# Patient Record
Sex: Male | Born: 1985 | Race: Black or African American | Hispanic: No | Smoking: Never smoker
Health system: Southern US, Community
[De-identification: ages and names within clinical notes are randomized; demographics above are authoritative.]

## PROBLEM LIST (undated history)

## (undated) DIAGNOSIS — I10 Essential (primary) hypertension: Secondary | ICD-10-CM

## (undated) DIAGNOSIS — E669 Obesity, unspecified: Secondary | ICD-10-CM

## (undated) HISTORY — DX: Essential (primary) hypertension: I10

## (undated) HISTORY — DX: Obesity, unspecified: E66.9

---

## 2019-03-18 ENCOUNTER — Encounter: Payer: Self-pay | Admitting: Family Medicine

## 2019-03-18 ENCOUNTER — Ambulatory Visit (INDEPENDENT_AMBULATORY_CARE_PROVIDER_SITE_OTHER): Payer: BC Managed Care – PPO | Admitting: Family Medicine

## 2019-03-18 ENCOUNTER — Other Ambulatory Visit: Payer: Self-pay

## 2019-03-18 VITALS — BP 150/100 | HR 98 | Temp 99.3°F | Ht 73.5 in | Wt 301.6 lb

## 2019-03-18 DIAGNOSIS — E669 Obesity, unspecified: Secondary | ICD-10-CM

## 2019-03-18 DIAGNOSIS — I10 Essential (primary) hypertension: Secondary | ICD-10-CM | POA: Diagnosis not present

## 2019-03-18 MED ORDER — AMLODIPINE BESYLATE 5 MG PO TABS: 5.0000 mg | ORAL_TABLET | Freq: Every day | ORAL | 2 refills | Status: DC

## 2019-03-18 MED ORDER — HYDROCHLOROTHIAZIDE 12.5 MG PO CAPS: 12.5000 mg | ORAL_CAPSULE | Freq: Every day | ORAL | 2 refills | Status: DC

## 2019-03-18 NOTE — Patient Instructions (Addendum)
Start back on the medications.  Keep an eye on your BP at home.  Goal BP is less than 130/80. It will take time to get there.   Cut back on foods high in sodium.  Start walking for exercise.   Follow up with me in 4 weeks. Bring in your BP cuff and your readings.     DASH Eating Plan DASH stands for "Dietary Approaches to Stop Hypertension." The DASH eating plan is a healthy eating plan that has been shown to reduce high blood pressure (hypertension). It may also reduce your risk for type 2 diabetes, heart disease, and stroke. The DASH eating plan may also help with weight loss. What are tips for following this plan?  General guidelines  Avoid eating more than 2,300 mg (milligrams) of salt (sodium) a day. If you have hypertension, you may need to reduce your sodium intake to 1,500 mg a day.  Limit alcohol intake to no more than 1 drink a day for nonpregnant women and 2 drinks a day for men. One drink equals 12 oz of beer, 5 oz of wine, or 1 oz of hard liquor.  Work with your health care provider to maintain a healthy body weight or to lose weight. Ask what an ideal weight is for you.  Get at least 30 minutes of exercise that causes your heart to beat faster (aerobic exercise) most days of the week. Activities may include walking, swimming, or biking.  Work with your health care provider or diet and nutrition specialist (dietitian) to adjust your eating plan to your individual calorie needs. Reading food labels   Check food labels for the amount of sodium per serving. Choose foods with less than 5 percent of the Daily Value of sodium. Generally, foods with less than 300 mg of sodium per serving fit into this eating plan.  To find whole grains, look for the word "whole" as the first word in the ingredient list. Shopping  Buy products labeled as "low-sodium" or "no salt added."  Buy fresh foods. Avoid canned foods and premade or frozen meals. Cooking  Avoid adding salt when  cooking. Use salt-free seasonings or herbs instead of table salt or sea salt. Check with your health care provider or pharmacist before using salt substitutes.  Do not fry foods. Cook foods using healthy methods such as baking, boiling, grilling, and broiling instead.  Cook with heart-healthy oils, such as olive, canola, soybean, or sunflower oil. Meal planning  Eat a balanced diet that includes: ? 5 or more servings of fruits and vegetables each day. At each meal, try to fill half of your plate with fruits and vegetables. ? Up to 6-8 servings of whole grains each day. ? Less than 6 oz of lean meat, poultry, or fish each day. A 3-oz serving of meat is about the same size as a deck of cards. One egg equals 1 oz. ? 2 servings of low-fat dairy each day. ? A serving of nuts, seeds, or beans 5 times each week. ? Heart-healthy fats. Healthy fats called Omega-3 fatty acids are found in foods such as flaxseeds and coldwater fish, like sardines, salmon, and mackerel.  Limit how much you eat of the following: ? Canned or prepackaged foods. ? Food that is high in trans fat, such as fried foods. ? Food that is high in saturated fat, such as fatty meat. ? Sweets, desserts, sugary drinks, and other foods with added sugar. ? Full-fat dairy products.  Do not salt foods  before eating.  Try to eat at least 2 vegetarian meals each week.  Eat more home-cooked food and less restaurant, buffet, and fast food.  When eating at a restaurant, ask that your food be prepared with less salt or no salt, if possible. What foods are recommended? The items listed may not be a complete list. Talk with your dietitian about what dietary choices are best for you. Grains Whole-grain or whole-wheat bread. Whole-grain or whole-wheat pasta. Brown rice. [place]. Bulgur. Whole-grain and low-sodium cereals. Pita bread. Low-fat, low-sodium crackers. Whole-wheat flour tortillas. Vegetables Fresh or frozen vegetables  (raw, steamed, roasted, or grilled). Low-sodium or reduced-sodium tomato and vegetable juice. Low-sodium or reduced-sodium tomato sauce and tomato paste. Low-sodium or reduced-sodium canned vegetables. Fruits All fresh, dried, or frozen fruit. Canned fruit in natural juice (without added sugar). Meat and other protein foods Skinless chicken or [place]. Ground chicken or [place]. Pork with fat trimmed off. Fish and seafood. Egg whites. Dried beans, peas, or lentils. Unsalted nuts, nut butters, and seeds. Unsalted canned beans. Lean cuts of beef with fat trimmed off. Low-sodium, lean deli meat. Dairy Low-fat (1%) or fat-free (skim) milk. Fat-free, low-fat, or reduced-fat cheeses. Nonfat, low-sodium ricotta or cottage cheese. Low-fat or nonfat yogurt. Low-fat, low-sodium cheese. Fats and oils Soft margarine without trans fats. Vegetable oil. Low-fat, reduced-fat, or light mayonnaise and salad dressings (reduced-sodium). Canola, safflower, olive, soybean, and sunflower oils. Avocado. Seasoning and other foods Herbs. Spices. Seasoning mixes without salt. Unsalted popcorn and pretzels. Fat-free sweets. What foods are not recommended? The items listed may not be a complete list. Talk with your dietitian about what dietary choices are best for you. Grains Baked goods made with fat, such as croissants, muffins, or some breads. Dry pasta or rice meal packs. Vegetables Creamed or fried vegetables. Vegetables in a cheese sauce. Regular canned vegetables (not low-sodium or reduced-sodium). Regular canned tomato sauce and paste (not low-sodium or reduced-sodium). Regular tomato and vegetable juice (not low-sodium or reduced-sodium). Angie Fava. Olives. Fruits Canned fruit in a light or heavy syrup. Fried fruit. Fruit in cream or butter sauce. Meat and other protein foods Fatty cuts of meat. Ribs. Fried meat. Berniece Salines. Sausage. Bologna and other processed lunch meats. Salami. Fatback. Hotdogs. Bratwurst. Salted nuts  and seeds. Canned beans with added salt. Canned or smoked fish. Whole eggs or egg yolks. Chicken or [place] with skin. Dairy Whole or 2% milk, cream, and half-and-half. Whole or full-fat cream cheese. Whole-fat or sweetened yogurt. Full-fat cheese. Nondairy creamers. Whipped toppings. Processed cheese and cheese spreads. Fats and oils Butter. Stick margarine. Lard. Shortening. Ghee. Bacon fat. Tropical oils, such as coconut, palm kernel, or palm oil. Seasoning and other foods Salted popcorn and pretzels. Onion salt, garlic salt, seasoned salt, table salt, and sea salt. Worcestershire sauce. Tartar sauce. Barbecue sauce. Teriyaki sauce. Soy sauce, including reduced-sodium. Steak sauce. Canned and packaged gravies. Fish sauce. Oyster sauce. Cocktail sauce. Horseradish that you find on the shelf. Ketchup. Mustard. Meat flavorings and tenderizers. Bouillon cubes. Hot sauce and Tabasco sauce. Premade or packaged marinades. Premade or packaged taco seasonings. Relishes. Regular salad dressings. Where to find more information:  National Heart, Lung, and [place]: https://wilson-eaton.com/  American Heart Association: www.heart.org Summary  The DASH eating plan is a healthy eating plan that has been shown to reduce high blood pressure (hypertension). It may also reduce your risk for type 2 diabetes, heart disease, and stroke.  With the DASH eating plan, you should limit salt (sodium) intake to 2,300 mg  a day. If you have hypertension, you may need to reduce your sodium intake to 1,500 mg a day.  When on the DASH eating plan, aim to eat more fresh fruits and vegetables, whole grains, lean proteins, low-fat dairy, and heart-healthy fats.  Work with your health care provider or diet and nutrition specialist (dietitian) to adjust your eating plan to your individual calorie needs. This information is not intended to replace advice given to you by your health care provider. Make sure you discuss any questions  you have with your health care provider. Document Revised: 12/09/2016 Document Reviewed: 12/21/2015 Elsevier Patient Education  2020 [employer].

## 2019-03-18 NOTE — Progress Notes (Signed)
   Subjective:    Patient ID: Don Ortega, male    DOB: 1985/12/13, 34 y.o.   MRN: [id number]  HPI Chief Complaint  Patient presents with  . new pt    new pt, get established, been on bp med for 2 years   He is new to the practice and here to establish care. 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  HTN- diagnosed 2018. Started on medication, he recalls taking losartan and his BP was not controlled. States his provider switched him to HCTZ and amlodipine. He took these for approx 4 months and then lost health insurance so he stopped the medication.  He denies fever, chills, headache, dizziness, chest pain, palpitations, shortness of breath, abdominal pain, nausea, vomiting, diarrhea, lower extremity edema.  No snoring or difficulty sleeping. Denies history of OSA.  States his diet is fairly high in sodium.  He eats fast food often.  Does not exercise.  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  Reviewed allergies, medications, past medical, surgical, family, and social history.    Review of Systems Pertinent positives and negatives in the history of present illness.     Objective:   Physical Exam BP (!) 150/100   Pulse 98   Temp 99.3 F (37.4 C)   Ht 6' 1.5" (1.867 m)   Wt (!) 301 lb 9.6 oz (136.8 kg)   SpO2 99%   BMI 39.25 kg/m   Alert and in no distress.  Cardiac exam shows a regular rhythm without murmurs or gallops. Lungs are clear to auscultation.  Extremities without edema.  DTRs are symmetric and normal.  Skin is warm and dry.       Assessment & Plan:  Uncontrolled hypertension - Plan: CBC with Differential/Platelet, Comprehensive metabolic panel, EKG 61-WERX, amLODipine (NORVASC) 5 MG tablet, hydrochlorothiazide (MICROZIDE) 12.5  MG capsule  Obesity (BMI 30-39.9) - Plan: CBC with Differential/Platelet, Comprehensive metabolic panel  Is a pleasant 34 year old male who is new to the practice and here to establish care. Uncontrolled hypertension and was recently given a 34-month extension by DOT provider to get his blood pressure under control.  He is motivated to do this. 2 years ago he was taking amlodipine and HCTZ and states his blood pressure was under good control then.  I will start him back on these medications.  Advised him to cut back on sodium in his diet and start walking.  Recommend gradual increase in exercise. EKG unremarkable today.  Read by myself and Dr. Redmond School. DASH diet handout given. Will check CBC, CMP. He will keep an eye on his blood pressure over the next 4 weeks and follow-up with me.

## 2019-03-19 LAB — COMPREHENSIVE METABOLIC PANEL
ALT: 89 IU/L — ABNORMAL HIGH (ref 0–44)
AST: 43 IU/L — ABNORMAL HIGH (ref 0–40)
Albumin/Globulin Ratio: 1.7 (ref 1.2–2.2)
Albumin: 4.8 g/dL (ref 4.0–5.0)
Alkaline Phosphatase: 82 IU/L (ref 39–117)
BUN/Creatinine Ratio: 5 — ABNORMAL LOW (ref 9–20)
BUN: 7 mg/dL (ref 6–20)
Bilirubin Total: 0.6 mg/dL (ref 0.0–1.2)
CO2: 23 mmol/L (ref 20–29)
Calcium: 10.6 mg/dL — ABNORMAL HIGH (ref 8.7–10.2)
Chloride: 103 mmol/L (ref 96–106)
Creatinine, Ser: 1.33 mg/dL — ABNORMAL HIGH (ref 0.76–1.27)
GFR calc Af Amer: 81 mL/min/{1.73_m2} (ref >59)
GFR calc non Af Amer: 70 mL/min/{1.73_m2} (ref >59)
Globulin, Total: 2.8 g/dL (ref 1.5–4.5)
Glucose: 87 mg/dL (ref 65–99)
Potassium: 3.9 mmol/L (ref 3.5–5.2)
Sodium: 143 mmol/L (ref 134–144)
Total Protein: 7.6 g/dL (ref 6.0–8.5)

## 2019-03-19 LAB — CBC WITH DIFFERENTIAL/PLATELET
Basophils Absolute: 0.1 10*3/uL (ref 0.0–0.2)
Basos: 1 %
EOS (ABSOLUTE): 0.1 10*3/uL (ref 0.0–0.4)
Eos: 1 %
Hematocrit: 50.3 % (ref 37.5–51.0)
Hemoglobin: 17.5 g/dL (ref 13.0–17.7)
Immature Grans (Abs): 0 10*3/uL (ref 0.0–0.1)
Immature Granulocytes: 0 %
Lymphocytes Absolute: 3.2 10*3/uL — ABNORMAL HIGH (ref 0.7–3.1)
Lymphs: 32 %
MCH: 30.2 pg (ref 26.6–33.0)
MCHC: 34.8 g/dL (ref 31.5–35.7)
MCV: 87 fL (ref 79–97)
Monocytes Absolute: 0.6 10*3/uL (ref 0.1–0.9)
Monocytes: 6 %
Neutrophils Absolute: 6 10*3/uL (ref 1.4–7.0)
Neutrophils: 60 %
Platelets: 338 10*3/uL (ref 150–450)
RBC: 5.8 x10E6/uL (ref 4.14–5.80)
RDW: 13.8 % (ref 11.6–15.4)
WBC: 10 10*3/uL (ref 3.4–10.8)

## 2019-03-27 LAB — SPECIMEN STATUS REPORT

## 2019-03-27 LAB — HEPATITIS PANEL, ACUTE
Hep A IgM: NEGATIVE
Hep B C IgM: NEGATIVE
Hep C Virus Ab: <0.1 s/co ratio (ref 0.0–0.9)
Hepatitis B Surface Ag: NEGATIVE

## 2019-04-07 ENCOUNTER — Encounter: Payer: Self-pay | Admitting: Family Medicine

## 2019-04-08 MED ORDER — AMLODIPINE BESYLATE 10 MG PO TABS: 10.0000 mg | ORAL_TABLET | Freq: Every day | ORAL | 0 refills | Status: DC

## 2019-04-15 ENCOUNTER — Telehealth: Payer: Self-pay | Admitting: Family Medicine

## 2019-04-15 NOTE — Telephone Encounter (Signed)
Pt coming in Friday for visit and wanted to let you know that his BP has still been high. Pt has to have oral surgery on the 12th and wants to see if he needs to do anything before then

## 2019-04-16 NOTE — Telephone Encounter (Signed)
Please ask him to bring in his BP readings and cuff to his visit and we will discuss options at that time.

## 2019-04-16 NOTE — Telephone Encounter (Signed)
Pt advised.

## 2019-04-16 NOTE — Telephone Encounter (Signed)
If he wants to come in before Friday, that will be fine also

## 2019-04-17 ENCOUNTER — Ambulatory Visit: Payer: BC Managed Care – PPO | Admitting: Family Medicine

## 2019-04-17 NOTE — Telephone Encounter (Signed)
Pt advised and is coming in tomorrow morning

## 2019-04-18 ENCOUNTER — Ambulatory Visit (INDEPENDENT_AMBULATORY_CARE_PROVIDER_SITE_OTHER): Payer: BC Managed Care – PPO | Admitting: Family Medicine

## 2019-04-18 ENCOUNTER — Encounter: Payer: Self-pay | Admitting: Family Medicine

## 2019-04-18 ENCOUNTER — Other Ambulatory Visit: Payer: Self-pay

## 2019-04-18 VITALS — BP 130/90 | HR 90 | Temp 97.7°F | Wt 291.0 lb

## 2019-04-18 DIAGNOSIS — I10 Essential (primary) hypertension: Secondary | ICD-10-CM | POA: Diagnosis not present

## 2019-04-18 DIAGNOSIS — E669 Obesity, unspecified: Secondary | ICD-10-CM

## 2019-04-18 DIAGNOSIS — R748 Abnormal levels of other serum enzymes: Secondary | ICD-10-CM

## 2019-04-18 NOTE — Progress Notes (Signed)
Subjective:    Patient ID: Don Ortega, male    DOB: 12/09/1985, 34 y.o.   MRN: [id number]  HPI Chief Complaint  Patient presents with  . follow-up on HTN    bp has been running 140-160/90-100s   He is here to follow-up on uncontrolled hypertension.  He is fairly new to me and this is our second visit. At his previous visit on 03/18/2019, he had reportedly been off of his hypertension medication and his blood pressure was significantly elevated at his DOT physical.  He was given a 10-month extension to get his blood pressure under control. We started him back on HCTZ and amlodipine.  He has been checking his blood pressure regularly and brought his cuff in today.  He contacted me approximately 2 weeks ago stating his blood pressures were not improved at all so we increase his amlodipine to 10 mg.  He is still taking HCTZ 12.5 mg  Reports making significant healthy changes to his diet and cutting back on sodium.  We screened for OSA and this does not appear to be an issue.  After further questioning, we did discover that he has been drinking 5 hour energy drinks for the past couple of months.   EKG was unremarkable at his previous visit.  Denies family history of hypertension.  His liver enzymes were elevated at his previous visit and he suspects this may be due to regular use of NSAIDs.  Since that visit he has been avoiding NSAIDs.  Denies regular alcohol use  He denies fever, chills, headaches, dizziness, fatigue, chest pain, palpitations, shortness of breath, nausea, vomiting, urinary symptoms, lower extremity edema.    Review of Systems Pertinent positives and negatives in the history of present illness.     Objective:   Physical Exam BP 130/90   Pulse 90   Temp 97.7 F (36.5 C)   Wt 291 lb (132 kg)   BMI 37.87 kg/m   Alert and oriented in no acute distress. Cardiac exam shows a regular sinus rhythm without murmurs or gallops. Lungs are clear to auscultation.   Extremities without edema.       Assessment & Plan:  Uncontrolled hypertension - Plan: US Renal Artery Stenosis, US Renal  Obesity (BMI 30-39.9)  Elevated liver enzymes - Plan: Comprehensive metabolic panel  Serum calcium elevated - Plan: Comprehensive metabolic panel  He has been keeping a close eye on his blood pressure at home, unfortunately the blood pressure cuff he has been using appears to be too small for his arm.  I recommend that he get a larger cuff. His blood pressure today is still elevated.  He reports good daily compliance with his medications.  Currently taking 10 mg of amlodipine and 12.5 mg of HCTZ. Recommend he continue with a low-sodium diet. Also recommend that he stop drinking 5-hour energy drinks and cut back on caffeine significantly. Discussed possible secondary causes for hypertension.  Plan to get a renal artery ultrasound to rule out RAS. 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  Discussed that medication does take time to lower blood pressure especially since he was off medication for so long. Reports having upcoming oral surgery scheduled.  Discussed that if his blood pressure significantly elevated that he may have to reschedule this for later date but this would be up to his surgeon. I will recheck liver enzymes, no regular alcohol use and he has  stopped NSAIDs. We will also need to recheck calcium level He will follow-up here in 4 weeks

## 2019-04-18 NOTE — Patient Instructions (Signed)
Stop drinking energy drinks and cut way back on caffeine in general.   Continue eating a low sodium diet.   Keep an eye on your BP at home.   We are sending you for an ultrasound of your kidneys and renal arteries.   Follow up with me in 4 weeks.

## 2019-04-19 ENCOUNTER — Ambulatory Visit: Payer: BC Managed Care – PPO | Admitting: Family Medicine

## 2019-04-19 LAB — COMPREHENSIVE METABOLIC PANEL
ALT: 73 IU/L — ABNORMAL HIGH (ref 0–44)
AST: 39 IU/L (ref 0–40)
Albumin/Globulin Ratio: 1.9 (ref 1.2–2.2)
Albumin: 5 g/dL (ref 4.0–5.0)
Alkaline Phosphatase: 80 IU/L (ref 39–117)
BUN/Creatinine Ratio: 5 — ABNORMAL LOW (ref 9–20)
BUN: 7 mg/dL (ref 6–20)
Bilirubin Total: 0.4 mg/dL (ref 0.0–1.2)
CO2: 24 mmol/L (ref 20–29)
Calcium: 10.5 mg/dL — ABNORMAL HIGH (ref 8.7–10.2)
Chloride: 104 mmol/L (ref 96–106)
Creatinine, Ser: 1.31 mg/dL — ABNORMAL HIGH (ref 0.76–1.27)
GFR calc Af Amer: 83 mL/min/{1.73_m2} (ref >59)
GFR calc non Af Amer: 72 mL/min/{1.73_m2} (ref >59)
Globulin, Total: 2.6 g/dL (ref 1.5–4.5)
Glucose: 96 mg/dL (ref 65–99)
Potassium: 4.3 mmol/L (ref 3.5–5.2)
Sodium: 145 mmol/L — ABNORMAL HIGH (ref 134–144)
Total Protein: 7.6 g/dL (ref 6.0–8.5)

## 2019-04-23 ENCOUNTER — Other Ambulatory Visit: Payer: Self-pay

## 2019-04-23 ENCOUNTER — Ambulatory Visit
Admission: RE | Admit: 2019-04-23 | Discharge: 2019-04-23 | Disposition: A | Discharge status: Home / Self Care | Payer: BC Managed Care – PPO | Source: Ambulatory Visit | Attending: Family Medicine | Admitting: Family Medicine

## 2019-04-23 ENCOUNTER — Other Ambulatory Visit: Payer: Self-pay | Admitting: Family Medicine

## 2019-04-23 DIAGNOSIS — R160 Hepatomegaly, not elsewhere classified: Secondary | ICD-10-CM

## 2019-04-23 DIAGNOSIS — I10 Essential (primary) hypertension: Secondary | ICD-10-CM

## 2019-04-23 DIAGNOSIS — K769 Liver disease, unspecified: Secondary | ICD-10-CM

## 2019-04-23 NOTE — Progress Notes (Signed)
He needs an MRI of his abdomen due to a liver mass. I sent him a message but please call him and let him know that this is most likely a benign (not worrisome) mass but we have to check to make sure. The order is in the computer.

## 2019-04-24 ENCOUNTER — Encounter: Payer: Self-pay | Admitting: Internal Medicine

## 2019-05-04 ENCOUNTER — Ambulatory Visit (HOSPITAL_COMMUNITY): Payer: BC Managed Care – PPO

## 2019-05-11 ENCOUNTER — Other Ambulatory Visit: Payer: Self-pay | Admitting: Family Medicine

## 2019-05-13 NOTE — Telephone Encounter (Signed)
Pt has upcoming appt on 5/6 and should have enough to appt

## 2019-05-16 ENCOUNTER — Ambulatory Visit: Payer: BC Managed Care – PPO | Admitting: Family Medicine

## 2019-05-23 ENCOUNTER — Ambulatory Visit (INDEPENDENT_AMBULATORY_CARE_PROVIDER_SITE_OTHER): Payer: BC Managed Care – PPO | Admitting: Family Medicine

## 2019-05-23 ENCOUNTER — Encounter: Payer: Self-pay | Admitting: Family Medicine

## 2019-05-23 ENCOUNTER — Other Ambulatory Visit: Payer: Self-pay

## 2019-05-23 VITALS — BP 140/90 | HR 104 | Wt 293.4 lb

## 2019-05-23 DIAGNOSIS — R160 Hepatomegaly, not elsewhere classified: Secondary | ICD-10-CM | POA: Diagnosis not present

## 2019-05-23 DIAGNOSIS — R748 Abnormal levels of other serum enzymes: Secondary | ICD-10-CM

## 2019-05-23 DIAGNOSIS — K760 Fatty (change of) liver, not elsewhere classified: Secondary | ICD-10-CM | POA: Insufficient documentation

## 2019-05-23 DIAGNOSIS — Z13220 Encounter for screening for lipoid disorders: Secondary | ICD-10-CM | POA: Insufficient documentation

## 2019-05-23 DIAGNOSIS — I10 Essential (primary) hypertension: Secondary | ICD-10-CM | POA: Diagnosis not present

## 2019-05-23 DIAGNOSIS — E669 Obesity, unspecified: Secondary | ICD-10-CM

## 2019-05-23 NOTE — Progress Notes (Signed)
   Subjective:    Patient ID: Don Ortega, male    DOB: 07-26-1985, 34 y.o.   MRN: [id number]  HPI Chief Complaint  Patient presents with  . 4 week follow-up    4 week follow-up on bp ranging 125-139/ 82-94   This is a 4 week follow up on uncontrolled HTN. He stopped drinking 5 hour energy drinks as well as most caffeine drinks.  He is taking HCTZ and amlodipine. BP at home has improved significantly.  He has occasionally seen readings in the 120s over low 80s.  States he is walking daily and eating healthier.   Discussed abnormal lab values including elevated serum calcium and liver enzymes.  He had a renal artery ultrasound which did not show stenosis.  Abnormal findings on ultrasound included hepatic steatosis and a right lobe liver mass.  MRI recommended and ordered.  States he could not afford to have the MRI done.  States he had to pay over $2000 upfront and cannot do this.  Unknown lipid history.  Denies fever, chills, dizziness, chest pain, palpitations, shortness of breath, abdominal pain, nausea, vomiting, diarrhea, LE edema.     Review of Systems Pertinent positives and negatives in the history of present illness.     Objective:   Physical Exam BP 140/90   Pulse (!) 104   Wt 293 lb 6.4 oz (133.1 kg)   BMI 38.18 kg/m   Alert and oriented in no acute distress.  Not otherwise examined today.      Assessment & Plan:  Essential hypertension -Blood pressure has improved but still not to recommended goal.  Recent RAS did not show stenosis.  Reports good daily compliance on his medications and no side effects.  Continue with low-sodium diet and increased physical activity.  He will be due for a DOT physical in early June.  Recommend he continue checking his blood pressure at home to make sure it is staying in goal range required for his DOT license.  Elevated liver enzymes - Plan: Comprehensive metabolic panel, Ambulatory referral to Gastroenterology -Denies  alcohol use.  No longer taking NSAIDs.  We will recheck his liver function test.  Liver mass, right lobe - Plan: Ambulatory referral to Gastroenterology -Ultrasound to rule out renal artery stenosis showed a hypoechoic mass in the right lobe of his liver.  An MRI was recommended and I did order this for the patient.  He canceled it due to financial issues.  States he has a high deductible and cannot afford to have this done.  I will refer him to GI for further evaluation and see if they have any other recommendations.  Fatty liver - Plan: Ambulatory referral to Gastroenterology -Denies alcohol use.  Encouraged him to continue with a healthy diet, exercise and weight loss.  Serum calcium elevated - Plan: Comprehensive metabolic panel, PTH, Intact and Calcium, VITAMIN D 25 Hydroxy (Vit-D Deficiency, Fractures) -Check labs to look for etiology  Screening for lipid disorders -Follow-up pending results  Obesity (BMI 30-39.9) -Encouraged him to keep up healthy diet and exercise.  Discussed potential long-term health consequences associated with obesity.

## 2019-05-24 ENCOUNTER — Encounter: Payer: Self-pay | Admitting: Family Medicine

## 2019-05-24 ENCOUNTER — Other Ambulatory Visit: Payer: Self-pay | Admitting: Family Medicine

## 2019-05-24 DIAGNOSIS — I10 Essential (primary) hypertension: Secondary | ICD-10-CM

## 2019-05-24 DIAGNOSIS — N185 Chronic kidney disease, stage 5: Secondary | ICD-10-CM

## 2019-05-24 DIAGNOSIS — E559 Vitamin D deficiency, unspecified: Secondary | ICD-10-CM

## 2019-05-24 DIAGNOSIS — I120 Hypertensive chronic kidney disease with stage 5 chronic kidney disease or end stage renal disease: Secondary | ICD-10-CM

## 2019-05-24 HISTORY — DX: Vitamin D deficiency, unspecified: E55.9

## 2019-05-24 HISTORY — DX: Hypercalcemia: E83.52

## 2019-05-24 LAB — COMPREHENSIVE METABOLIC PANEL
ALT: 71 IU/L — ABNORMAL HIGH (ref 0–44)
AST: 35 IU/L (ref 0–40)
Albumin/Globulin Ratio: 1.9 (ref 1.2–2.2)
Albumin: 4.7 g/dL (ref 4.0–5.0)
Alkaline Phosphatase: 79 IU/L (ref 39–117)
BUN/Creatinine Ratio: 5 — ABNORMAL LOW (ref 9–20)
BUN: 7 mg/dL (ref 6–20)
Bilirubin Total: 0.4 mg/dL (ref 0.0–1.2)
CO2: 23 mmol/L (ref 20–29)
Calcium: 10.4 mg/dL — ABNORMAL HIGH (ref 8.7–10.2)
Chloride: 104 mmol/L (ref 96–106)
Creatinine, Ser: 1.32 mg/dL — ABNORMAL HIGH (ref 0.76–1.27)
GFR calc Af Amer: 82 mL/min/{1.73_m2} (ref >59)
GFR calc non Af Amer: 71 mL/min/{1.73_m2} (ref >59)
Globulin, Total: 2.5 g/dL (ref 1.5–4.5)
Glucose: 91 mg/dL (ref 65–99)
Potassium: 3.8 mmol/L (ref 3.5–5.2)
Sodium: 143 mmol/L (ref 134–144)
Total Protein: 7.2 g/dL (ref 6.0–8.5)

## 2019-05-24 LAB — PTH, INTACT AND CALCIUM: PTH: 27 pg/mL (ref 15–65)

## 2019-05-24 LAB — VITAMIN D 25 HYDROXY (VIT D DEFICIENCY, FRACTURES): Vit D, 25-Hydroxy: 14.1 ng/mL — ABNORMAL LOW (ref 30.0–100.0)

## 2019-05-24 MED ORDER — VITAMIN D (ERGOCALCIFEROL) 1.25 MG (50000 UNIT) PO CAPS: 50000.0000 [IU] | ORAL_CAPSULE | ORAL | 0 refills | Status: DC

## 2019-05-24 MED ORDER — LOSARTAN POTASSIUM 25 MG PO TABS: 25.0000 mg | ORAL_TABLET | Freq: Every day | ORAL | 0 refills | Status: DC

## 2019-05-24 NOTE — Progress Notes (Signed)
Ok to cancel lipids and we will check in 4 weeks at his f/u visit. Ask him to fast for that appt. Thanks.

## 2019-05-24 NOTE — Progress Notes (Signed)
Please call him. I am adding a BP medication to his current regimen to help with his kidney function. I will send this in today. Also, his vitamin D is very low. I will also send in a prescription to help with this. His liver enzyme is still elevated and GI will address this.  Follow up with me in 4 weeks.

## 2019-05-25 LAB — LIPID PANEL W/O CHOL/HDL RATIO
Cholesterol, Total: 173 mg/dL (ref 100–199)
HDL: 24 mg/dL — ABNORMAL LOW (ref >39)
LDL Chol Calc (NIH): 61 mg/dL (ref 0–99)
Triglycerides: 580 mg/dL (ref 0–149)
VLDL Cholesterol Cal: 88 mg/dL — ABNORMAL HIGH (ref 5–40)

## 2019-05-25 LAB — SPECIMEN STATUS REPORT

## 2019-05-26 NOTE — Progress Notes (Signed)
His cholesterol panel was not cancelled after all. This is not accurate since he ate before his appointment. We can do this at his 4 week follow up and he needs to come in fasting.

## 2019-06-13 ENCOUNTER — Encounter: Payer: BC Managed Care – PPO | Admitting: Medical

## 2019-06-20 ENCOUNTER — Telehealth: Payer: Self-pay

## 2019-06-20 DIAGNOSIS — I10 Essential (primary) hypertension: Secondary | ICD-10-CM

## 2019-06-20 MED ORDER — HYDROCHLOROTHIAZIDE 12.5 MG PO CAPS: 12.5000 mg | ORAL_CAPSULE | Freq: Every day | ORAL | 2 refills | Status: DC

## 2019-06-20 NOTE — Telephone Encounter (Signed)
Pt. Called stating he needs a refill on his hctz to the [facility] [address]. Last apt 05/23/19

## 2019-06-20 NOTE — Telephone Encounter (Signed)
Sent refill to pharmacy. 

## 2019-06-21 ENCOUNTER — Ambulatory Visit: Payer: BC Managed Care – PPO | Admitting: Family Medicine

## 2019-06-27 ENCOUNTER — Ambulatory Visit: Payer: BC Managed Care – PPO | Admitting: Family Medicine

## 2019-07-03 ENCOUNTER — Encounter: Payer: Self-pay | Admitting: Nurse Practitioner

## 2019-08-01 ENCOUNTER — Other Ambulatory Visit: Payer: Self-pay | Admitting: Family Medicine

## 2019-08-08 ENCOUNTER — Ambulatory Visit (INDEPENDENT_AMBULATORY_CARE_PROVIDER_SITE_OTHER): Payer: BC Managed Care – PPO | Admitting: Nurse Practitioner

## 2019-08-08 ENCOUNTER — Other Ambulatory Visit (INDEPENDENT_AMBULATORY_CARE_PROVIDER_SITE_OTHER): Payer: BC Managed Care – PPO

## 2019-08-08 ENCOUNTER — Encounter: Payer: Self-pay | Admitting: Nurse Practitioner

## 2019-08-08 ENCOUNTER — Other Ambulatory Visit: Payer: Self-pay

## 2019-08-08 VITALS — BP 134/86 | HR 103 | Ht 74.0 in | Wt 295.0 lb

## 2019-08-08 DIAGNOSIS — R7989 Other specified abnormal findings of blood chemistry: Secondary | ICD-10-CM

## 2019-08-08 DIAGNOSIS — K769 Liver disease, unspecified: Secondary | ICD-10-CM

## 2019-08-08 DIAGNOSIS — R160 Hepatomegaly, not elsewhere classified: Secondary | ICD-10-CM

## 2019-08-08 DIAGNOSIS — K760 Fatty (change of) liver, not elsewhere classified: Secondary | ICD-10-CM

## 2019-08-08 LAB — HEPATIC FUNCTION PANEL
ALT: 85 U/L — ABNORMAL HIGH (ref 0–53)
AST: 40 U/L — ABNORMAL HIGH (ref 0–37)
Albumin: 4.8 g/dL (ref 3.5–5.2)
Alkaline Phosphatase: 63 U/L (ref 39–117)
Bilirubin, Direct: 0.2 mg/dL (ref 0.0–0.3)
Total Bilirubin: 0.5 mg/dL (ref 0.2–1.2)
Total Protein: 7.7 g/dL (ref 6.0–8.3)

## 2019-08-08 LAB — FERRITIN: Ferritin: 104.8 ng/mL (ref 22.0–322.0)

## 2019-08-08 LAB — IBC PANEL
Iron: 95 ug/dL (ref 42–165)
Saturation Ratios: 24 % (ref 20.0–50.0)
Transferrin: 283 mg/dL (ref 212.0–360.0)

## 2019-08-08 LAB — IGA: IgA: 117 mg/dL (ref 68–378)

## 2019-08-08 NOTE — Progress Notes (Signed)
ASSESSMENT / PLAN:    Don Ortega is a 34 y.o. male PMH significant for,  but not necessarily limited to HTN, Vitamin D deficiency, obesity    # Elevated LFTs --ALT elevated since March but < 2 x ULN .  --HBV S ag negative, HCV negative. Will check for HAV and HBV immunity, may need vaccination.  --Consumes very little etoh and pattern of elevation not c/w Etoh --No culprit medications --Suspect fatty liver causing elevated ALT but will need to labs to rule out other etiologies of chronic liver disease --Triglycerides are elevated at 580 would ask PCP to follow and consider treatment if no improvement with weight loss.  --We discussed need for weight loss. 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 Lost several pounds on low carb diet two years ago but gained it all back. He will go [email or web address] and review section on low carb diet.  --follow up with me to review test results in 3-4 weeks.   # Liver lesion, incidental finding on renal US in March 2021 --4 cm hypoechoic mass in right liver, could be area of fatty sparing.  --Proceed with MRI to better characterize lesion.      HPI:     Chief Complaint:  Liver tests abnormal.    Don Ortega is a 34 yo male referred by PCP for elevated LFTs.  ALT mildly elevated since March of this year. No Marlin of liver disease. Consumes small amount of Etoh on special occasions only. Takes Motrin but not on a regular basis n the last year. No herbs or vitamins. Takes two anti-hypertensives, no other medications.   No GI complaints. No general medical complaints.   Data Reviewed:  05/23/2019 BUN 7, creatinine 1.3, ALT 71 remainder of LFTs normal.  Cholesterol 173, triglycerides 580, vitamin D low at 14  03/18/19 Normal CBC   Past Medical History:  Diagnosis Date  . Hypercalcemia 05/24/2019  . Hypertension   . Obesity   . Vitamin D deficiency 05/24/2019     History reviewed. No pertinent surgical history. Family  History  Problem Relation Age of Onset  . Breast cancer Mother   . Colon polyps Mother   . Diabetes Maternal Great-grandmother   . Colon cancer Neg Hx   . Esophageal cancer Neg Hx    Social History   Tobacco Use  . Smoking status: Never Smoker  . Smokeless tobacco: Never Used  Vaping Use  . Vaping Use: Never used  Substance Use Topics  . Alcohol use: Never  . Drug use: Never   Current Outpatient Medications  Medication Sig Dispense Refill  . amLODipine (NORVASC) 10 MG tablet Take 1 tablet by mouth daily 90 tablet 0  . hydrochlorothiazide (MICROZIDE) 12.5 MG capsule Take 1 capsule (12.5 mg total) by mouth daily. 30 capsule 2   No current facility-administered medications for this visit.   No Known Allergies   Review of Systems: Positive for allergy, sinus trouble.  All other systems reviewed and negative except where noted in HPI.   Creatinine clearance cannot be calculated (Patient's most recent lab result is older than the maximum 21 days allowed.)   Physical Exam:    Wt Readings from Last 3 Encounters:  08/08/19 (!) 295 lb (133.8 kg)  05/23/19 293 lb 6.4 oz (133.1 kg)  04/18/19 291 lb (132 kg)    BP (!) 134/86   Pulse 103   Ht 6\' 2"  (  1.88 m)   Wt (!) 295 lb (133.8 kg)   BMI 37.88 kg/m  Constitutional:  Pleasant male in no acute distress. Psychiatric: Normal mood and affect. Behavior is normal. EENT: Pupils normal.  Conjunctivae are normal. No scleral icterus. Neck supple.  Cardiovascular: Normal rate, regular rhythm. No edema Pulmonary/chest: Effort normal and breath sounds normal. No wheezing, rales or rhonchi. Abdominal: Soft, nondistended, nontender. Bowel sounds active throughout. There are no masses palpable. No hepatomegaly. Neurological: Alert and oriented to person place and time. Skin: Skin is warm and dry. No rashes noted.  [name], NP  08/08/2019, 8:42 AM  Cc:  Referring Provider Girtha Rm, NP-C

## 2019-08-08 NOTE — Patient Instructions (Signed)
If you are age 33 or older, your body mass index should be between 23-30. Your Body mass index is 37.88 kg/m. If this is out of the aforementioned range listed, please consider follow up with your Primary Care Provider.  If you are age 66 or younger, your body mass index should be between 19-25. Your Body mass index is 37.88 kg/m. If this is out of the aformentioned range listed, please consider follow up with your Primary Care Provider.   Your provider has requested that you go to the basement level for lab work before leaving today. Press "B" on the elevator. The lab is located at the first door on the left as you exit the elevator.  You have been scheduled for an MRI at [facility] on  Your appointment time is 7:00am. Please arrive 15 minutes prior to your appointment time for registration purposes. Please make certain not to have anything to eat or drink 6 hours prior to your test. In addition, if you have any metal in your body, have a pacemaker or defibrillator, please be sure to let your ordering physician know. This test typically takes 45 minutes to 1 hour to complete. Should you need to reschedule, please call [phone] to do so.  Please go to [email or web address]- low carb section.  Due to recent changes in healthcare laws, you may see the results of your imaging and laboratory studies on MyChart before your provider has had a chance to review them.  We understand that in some cases there may be results that are confusing or concerning to you. Not all laboratory results come back in the same time frame and the provider may be waiting for multiple results in order to interpret others.  Please give [place] 48 hours in order for your provider to thoroughly review all the results before contacting the office for clarification of your results.

## 2019-08-11 NOTE — Progress Notes (Signed)
Attending Physician's Attestation   I have reviewed the chart.   I agree with the Advanced Practitioner's note, impression, and recommendations with any updates as below. Agree with follow-up of laboratory evaluation.  Hold on liver biopsy for now.  Imaging to further delineate possible liver lesion though suspect this will be a likely fatty infiltration.   Justice Britain, MD [facility] Office # [phone]

## 2019-08-12 LAB — ANTI-SMOOTH MUSCLE ANTIBODY, IGG: Actin (Smooth Muscle) Antibody (IGG): <20 U (ref <20)

## 2019-08-12 LAB — HEPATITIS A ANTIBODY, TOTAL: Hepatitis A AB,Total: NONREACTIVE

## 2019-08-12 LAB — HEPATITIS B SURFACE ANTIBODY,QUALITATIVE: Hep B S Ab: REACTIVE — AB

## 2019-08-12 LAB — MITOCHONDRIAL ANTIBODIES: Mitochondrial M2 Ab, IgG: <=20 U

## 2019-08-12 LAB — ANA: Anti Nuclear Antibody (ANA): NEGATIVE

## 2019-08-12 LAB — ALPHA-1-ANTITRYPSIN: A-1 Antitrypsin, Ser: 142 mg/dL (ref 83–199)

## 2019-08-12 LAB — CERULOPLASMIN: Ceruloplasmin: 27 mg/dL (ref 18–36)

## 2019-08-12 LAB — TISSUE TRANSGLUTAMINASE, IGA: (tTG) Ab, IgA: 1 U/mL

## 2019-08-16 ENCOUNTER — Other Ambulatory Visit (HOSPITAL_COMMUNITY): Payer: BC Managed Care – PPO

## 2019-08-22 ENCOUNTER — Other Ambulatory Visit: Payer: Self-pay

## 2019-08-22 ENCOUNTER — Ambulatory Visit (HOSPITAL_COMMUNITY)
Admission: RE | Admit: 2019-08-22 | Discharge: 2019-08-22 | Disposition: A | Discharge status: Home / Self Care | Payer: BC Managed Care – PPO | Source: Ambulatory Visit | Attending: Nurse Practitioner | Admitting: Nurse Practitioner

## 2019-08-22 DIAGNOSIS — R7989 Other specified abnormal findings of blood chemistry: Secondary | ICD-10-CM | POA: Insufficient documentation

## 2019-08-22 DIAGNOSIS — K769 Liver disease, unspecified: Secondary | ICD-10-CM | POA: Diagnosis present

## 2019-08-22 DIAGNOSIS — R160 Hepatomegaly, not elsewhere classified: Secondary | ICD-10-CM

## 2019-08-22 DIAGNOSIS — K760 Fatty (change of) liver, not elsewhere classified: Secondary | ICD-10-CM | POA: Diagnosis present

## 2019-08-22 MED ORDER — GADOBUTROL 1 MMOL/ML IV SOLN
10.0000 mL | Freq: Once | INTRAVENOUS | Status: AC | PRN
  Administered 2019-08-22: 10 mL via INTRAVENOUS

## 2019-10-07 ENCOUNTER — Ambulatory Visit: Payer: BC Managed Care – PPO | Admitting: Nurse Practitioner

## 2019-11-06 ENCOUNTER — Ambulatory Visit: Payer: BC Managed Care – PPO | Admitting: Nurse Practitioner

## 2019-11-26 ENCOUNTER — Other Ambulatory Visit: Payer: Self-pay

## 2019-11-26 DIAGNOSIS — R7989 Other specified abnormal findings of blood chemistry: Secondary | ICD-10-CM

## 2019-11-29 ENCOUNTER — Other Ambulatory Visit: Payer: Self-pay | Admitting: Family Medicine

## 2019-11-29 DIAGNOSIS — I10 Essential (primary) hypertension: Secondary | ICD-10-CM

## 2020-01-17 ENCOUNTER — Telehealth: Payer: Self-pay

## 2020-01-17 MED ORDER — AMLODIPINE BESYLATE 10 MG PO TABS: 10.0000 mg | ORAL_TABLET | Freq: Every day | ORAL | 0 refills | Status: DC

## 2020-01-17 NOTE — Telephone Encounter (Signed)
Received fax from [facility] for a refill on the pts. Amlodipine pt. Last apt was 05/23/19.

## 2020-01-17 NOTE — Telephone Encounter (Signed)
done

## 2020-03-21 ENCOUNTER — Other Ambulatory Visit: Payer: Self-pay | Admitting: Family Medicine

## 2020-03-21 DIAGNOSIS — I10 Essential (primary) hypertension: Secondary | ICD-10-CM

## 2020-04-06 ENCOUNTER — Ambulatory Visit (HOSPITAL_COMMUNITY)
Admission: EM | Admit: 2020-04-06 | Discharge: 2020-04-06 | Disposition: A | Discharge status: Home / Self Care | Payer: BC Managed Care – PPO | Attending: Internal Medicine | Admitting: Internal Medicine

## 2020-04-06 ENCOUNTER — Other Ambulatory Visit: Payer: Self-pay

## 2020-04-06 ENCOUNTER — Encounter (HOSPITAL_COMMUNITY): Payer: Self-pay | Admitting: Emergency Medicine

## 2020-04-06 DIAGNOSIS — M545 Low back pain, unspecified: Secondary | ICD-10-CM

## 2020-04-06 LAB — POCT URINALYSIS DIPSTICK, ED / UC
Bilirubin Urine: NEGATIVE
Glucose, UA: NEGATIVE mg/dL
Hgb urine dipstick: NEGATIVE
Leukocytes,Ua: NEGATIVE
Nitrite: NEGATIVE
Protein, ur: NEGATIVE mg/dL
Specific Gravity, Urine: >=1.03 (ref 1.005–1.030)
Urobilinogen, UA: 0.2 mg/dL (ref 0.0–1.0)
pH: 5.5 (ref 5.0–8.0)

## 2020-04-06 MED ORDER — IBUPROFEN 600 MG PO TABS
600.0000 mg | ORAL_TABLET | Freq: Four times a day (QID) | ORAL | 0 refills | Status: DC | PRN
Start: 2020-04-06 — End: 2023-11-08

## 2020-04-06 MED ORDER — CYCLOBENZAPRINE HCL 10 MG PO TABS
10.0000 mg | ORAL_TABLET | Freq: Two times a day (BID) | ORAL | 0 refills | Status: DC | PRN
Start: 2020-04-06 — End: 2023-11-08

## 2020-04-06 NOTE — ED Triage Notes (Signed)
Pt presents with right side lower back pain xs 3 days. Denies any fall or injury.

## 2020-04-06 NOTE — ED Provider Notes (Signed)
[place]    CSN: [id number] Arrival date & time: 04/06/20  [id number]      History   Chief Complaint Chief Complaint  Patient presents with  . Back Pain    Right Lower    HPI Don Ortega is a 35 y.o. male comes to the urgent care with 3-day history of right-sided low back pain.  Patient is a Administrator by profession.  On Friday patient drove to [facility] and braided a forklift on a bumpy road for 3 hours.  The next day he started experiencing sharp, severe low back pain.  Pain is aggravated by movement.  No known relieving factors.  Pain does not radiate into the right leg.  His back feels stiff.  No weakness in the legs.  No difficulties with bowel movement or voiding.  Pain is currently better and of moderate severity.  Patient had a similar episode 2 years ago.   HPI  Past Medical History:  Diagnosis Date  . Hypercalcemia 05/24/2019  . Hypertension   . Obesity   . Vitamin D deficiency 05/24/2019    Patient Active Problem List   Diagnosis Date Noted  . Vitamin D deficiency 05/24/2019  . Hypercalcemia 05/24/2019  . Fatty liver 05/23/2019  . Liver mass, right lobe 05/23/2019  . Screening for lipid disorders 05/23/2019  . Hypertension     History reviewed. No pertinent surgical history.     Home Medications    Prior to Admission medications   Medication Sig Start Date End Date Taking? Authorizing Provider  cyclobenzaprine (FLEXERIL) 10 MG tablet Take 1 tablet (10 mg total) by mouth 2 (two) times daily as needed for muscle spasms. 04/06/20  Yes Jasmyne Lodato, Myrene Galas, MD  ibuprofen (ADVIL) 600 MG tablet Take 1 tablet (600 mg total) by mouth every 6 (six) hours as needed. 04/06/20  Yes Aily Tzeng, Myrene Galas, MD  amLODipine (NORVASC) 10 MG tablet Take 1 tablet (10 mg total) by mouth daily. 01/17/20   Henson, Vickie L, NP-C  hydrochlorothiazide (MICROZIDE) 12.5 MG capsule Take 1 capsule by mouth once daily 03/23/20   Girtha Rm, NP-C    Family  History Family History  Problem Relation Age of Onset  . Breast cancer Mother   . Colon polyps Mother   . Diabetes Maternal Great-grandmother   . Colon cancer Neg Hx   . Esophageal cancer Neg Hx     Social History Social History   Tobacco Use  . Smoking status: Never Smoker  . Smokeless tobacco: Never Used  Vaping Use  . Vaping Use: Never used  Substance Use Topics  . Alcohol use: Never  . Drug use: Never     Allergies   Patient has no known allergies.   Review of Systems Review of Systems  Constitutional: Negative.   Gastrointestinal: Negative.   Genitourinary: Negative.   Musculoskeletal: Positive for back pain. Negative for neck pain and neck stiffness.  Skin: Negative.   Neurological: Negative.   Psychiatric/Behavioral: Negative.      Physical Exam Triage Vital Signs ED Triage Vitals  Enc Vitals Group     BP 04/06/20 0837 (!) 150/118     Pulse Rate 04/06/20 0837 (!) 104     Resp 04/06/20 0837 18     Temp 04/06/20 0837 98.3 F (36.8 C)     Temp Source 04/06/20 0837 Oral     SpO2 04/06/20 0837 96 %     Weight --      Height --  Head Circumference --      Peak Flow --      Pain Score 04/06/20 0836 5     Pain Loc --      Pain Edu? --      Excl. in [place]? --    No data found.  Updated Vital Signs BP (!) 150/118 (BP Location: Right Arm)   Pulse (!) 104   Temp 98.3 F (36.8 C) (Oral)   Resp 18   SpO2 96%   Visual Acuity Right Eye Distance:   Left Eye Distance:   Bilateral Distance:    Right Eye Near:   Left Eye Near:    Bilateral Near:     Physical Exam Vitals and nursing note reviewed.  Constitutional:      General: He is not in acute distress.    Appearance: He is not ill-appearing.  Cardiovascular:     Rate and Rhythm: Normal rate and regular rhythm.  Musculoskeletal:     Comments: Tenderness on palpation over the right paraspinal muscle in the lumbosacral region.  Neurological:     Mental Status: He is alert.      UC  Treatments / Results  Labs (all labs ordered are listed, but only abnormal results are displayed) Labs Reviewed  POCT URINALYSIS DIPSTICK, ED / UC - Abnormal; Notable for the following components:      Result Value   Ketones, ur TRACE (*)    All other components within normal limits    EKG   Radiology No results found.  Procedures Procedures (including critical care time)  Medications Ordered in UC Medications - No data to display  Initial Impression / Assessment and Plan / UC Course  I have reviewed the triage vital signs and the nursing notes.  Pertinent labs & imaging results that were available during my care of the patient were reviewed by me and considered in my medical decision making (see chart for details).     1.  Acute low back pain without sciatica: Gentle range of motion exercises Ibuprofen as needed for pain Flexeril as needed for muscle spasm-patient is advised to avoid driving because of drowsiness Heating pad use with a 10-minute 1-10 minutes off cycle twice daily If symptoms worsen patient is advised to return to urgent care Patient verbalized understanding of the plan. Final Clinical Impressions(s) / UC Diagnoses   Final diagnoses:  Acute midline low back pain without sciatica     Discharge Instructions     Gentle range of motion exercises Take medications as prescribed Heating therapy- 10 minutes on-10 minutes off cycle twice daily Return to urgent care if symptoms worsen.  If you experience numbness in the right leg, weakness in the right leg-please return to the urgent care to be reevaluated.   ED Prescriptions    Medication Sig Dispense Auth. Provider   cyclobenzaprine (FLEXERIL) 10 MG tablet Take 1 tablet (10 mg total) by mouth 2 (two) times daily as needed for muscle spasms. 20 tablet Shakeema Lippman, Myrene Galas, MD   ibuprofen (ADVIL) 600 MG tablet Take 1 tablet (600 mg total) by mouth every 6 (six) hours as needed. 30 tablet Tanaiya Kolarik, Myrene Galas, MD      PDMP not reviewed this encounter.   Chase Picket, MD 04/06/20 0930

## 2020-04-06 NOTE — Discharge Instructions (Signed)
Gentle range of motion exercises Take medications as prescribed Heating therapy- 10 minutes on-10 minutes off cycle twice daily Return to urgent care if symptoms worsen.  If you experience numbness in the right leg, weakness in the right leg-please return to the urgent care to be reevaluated.

## 2020-05-25 ENCOUNTER — Encounter: Payer: Self-pay | Admitting: Family Medicine

## 2020-05-25 DIAGNOSIS — I10 Essential (primary) hypertension: Secondary | ICD-10-CM

## 2020-05-25 MED ORDER — HYDROCHLOROTHIAZIDE 12.5 MG PO CAPS: 12.5000 mg | ORAL_CAPSULE | Freq: Every day | ORAL | 0 refills | Status: DC

## 2020-06-08 IMAGING — US US RENAL ARTERY STENOSIS
1 series · 13 of 25 positions shown · non-contrast
Comparison: None.

CLINICAL DATA: Uncontrolled hypertension. Evaluate for renal artery
stenosis.

EXAM:
RENAL/URINARY TRACT ULTRASOUND
RENAL DUPLEX DOPPLER ULTRASOUND

[Series 1: us renal artery stenosis · 0.30mm/px · 13 of 69 slices shown]
[im 1/69]
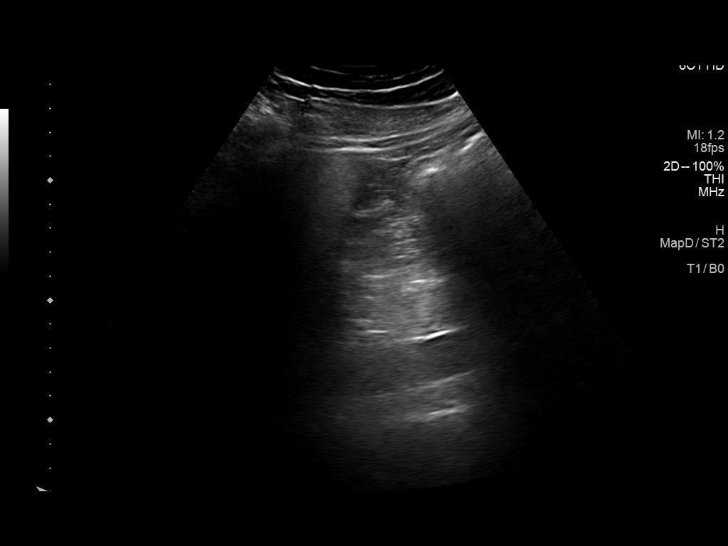
[im 6/69]
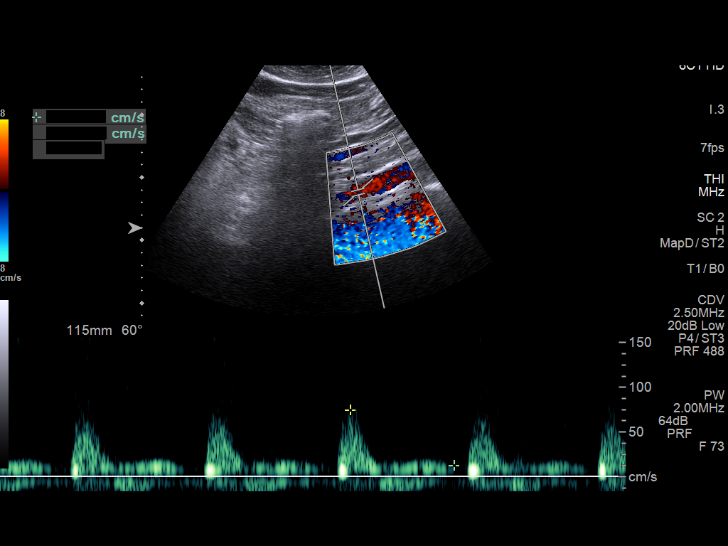
[im 12/69]
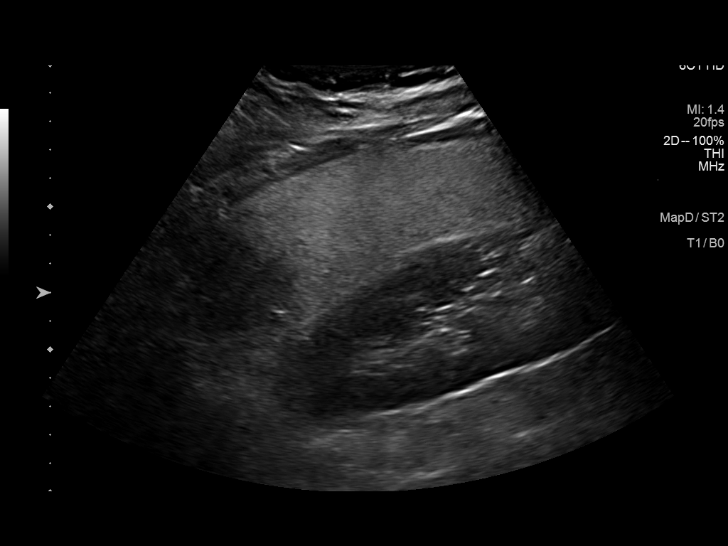
[im 18/69]
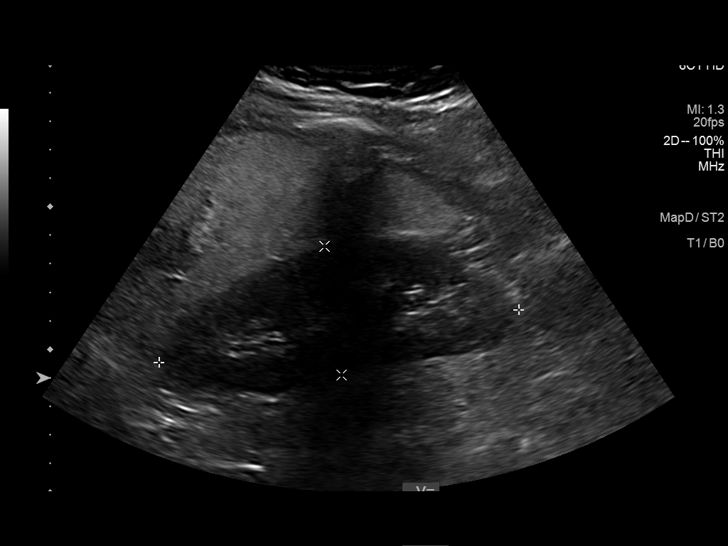
[im 23/69]
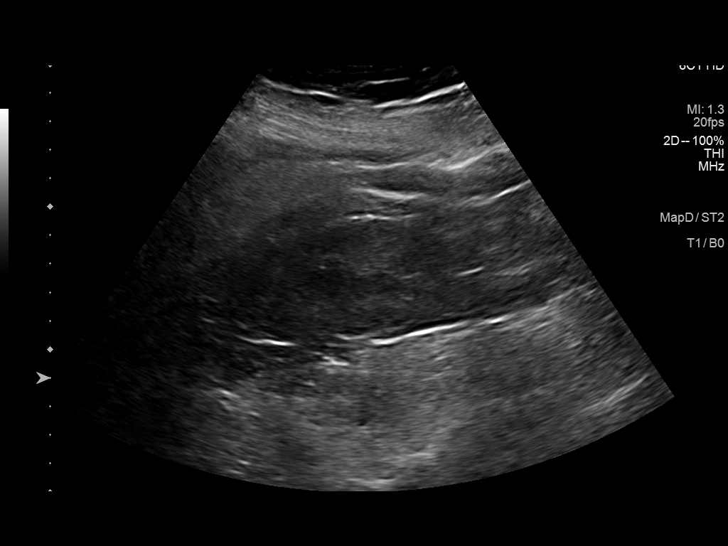
[im 29/69]
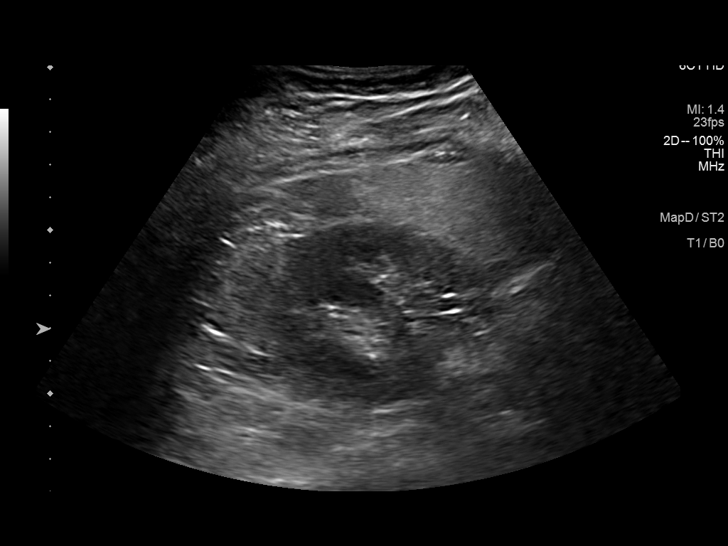
[im 35/69]
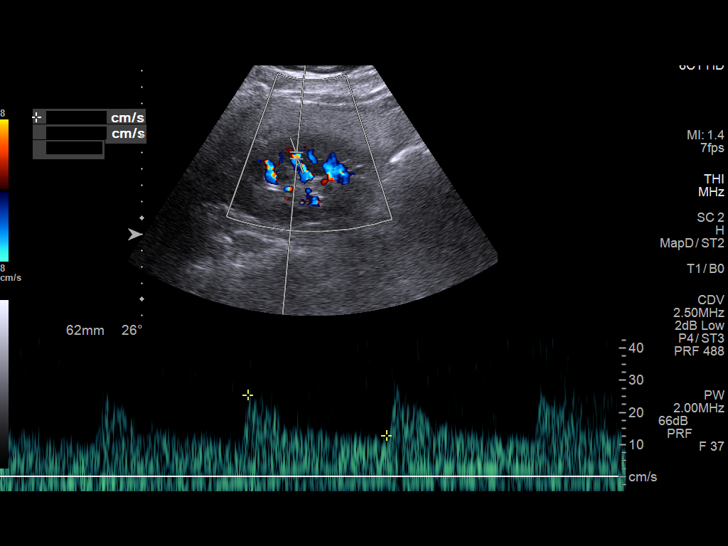
[im 40/69]
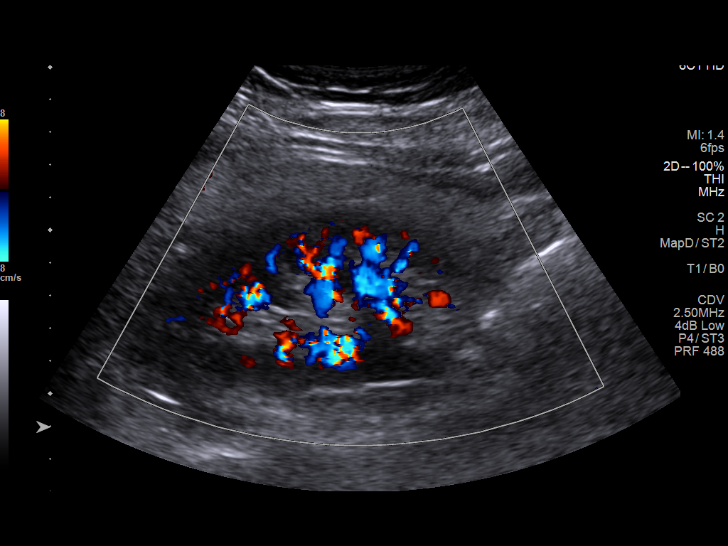
[im 46/69]
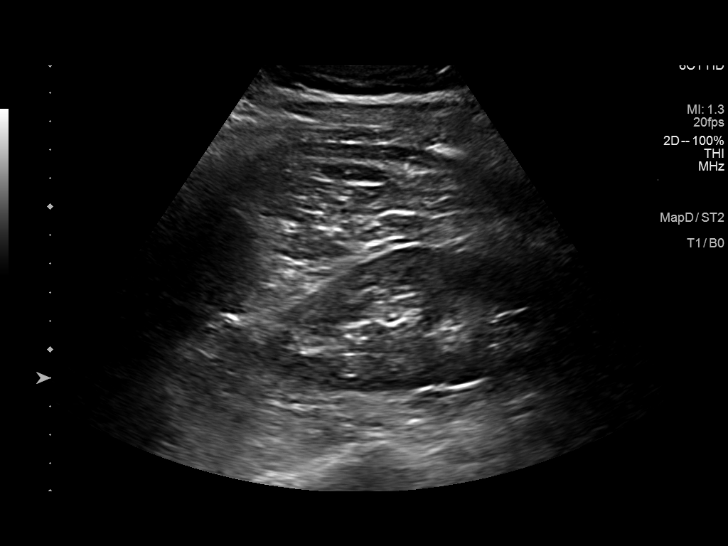
[im 52/69]
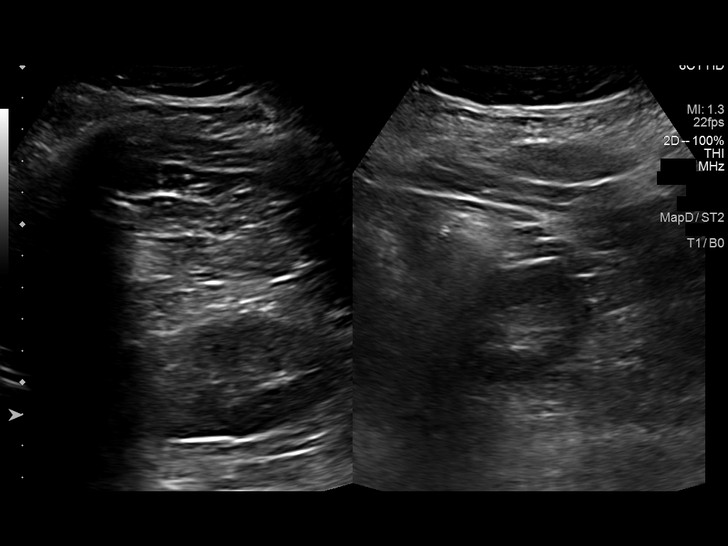
[im 57/69]
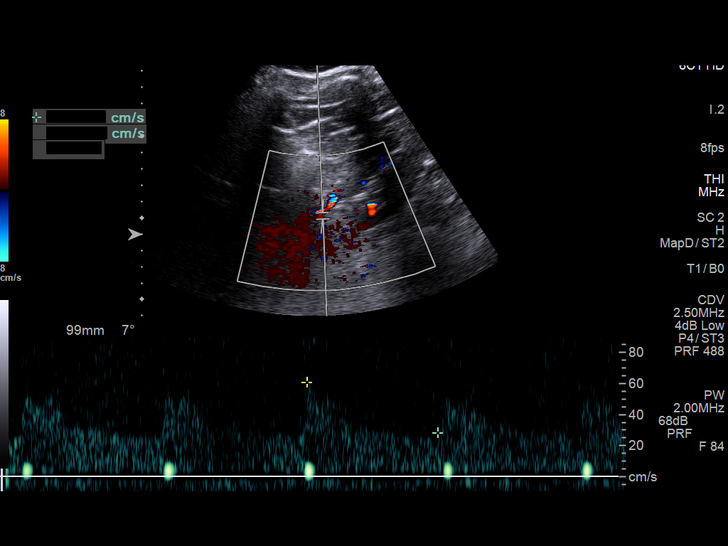
[im 63/69]
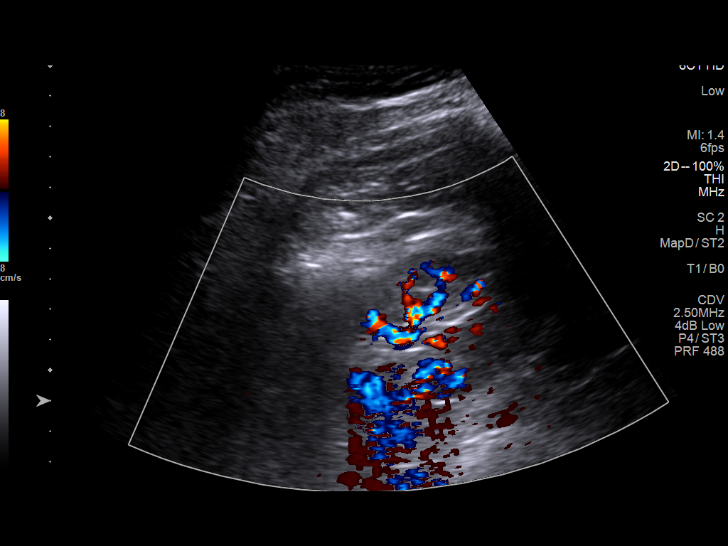
[im 69/69]
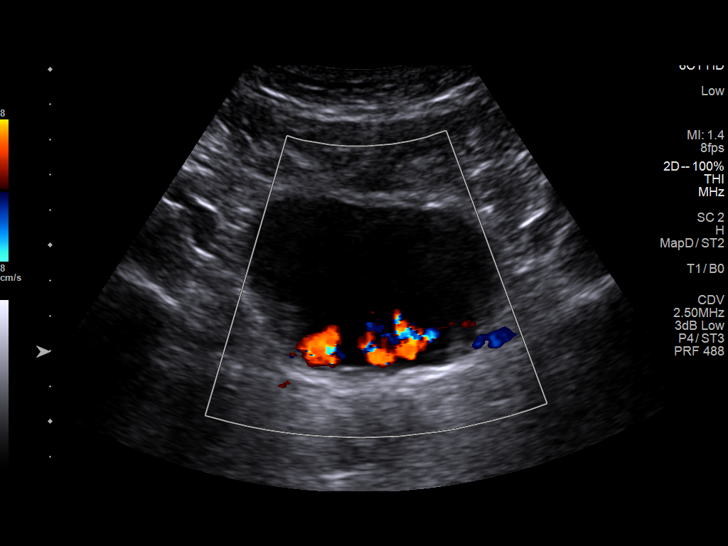

[13 of 25 positions shown; findings below may reference images not displayed]

FINDINGS: Examination is degraded due to patient body habitus and bowel gas.

Right Kidney:

Normal cortical thickness, echogenicity and size, measuring 12.7 cm
in length. No focal renal lesions. No echogenic renal stones. No
urinary obstruction.

Left Kidney:

Normal cortical thickness, echogenicity and size, measuring 13.0 cm
in length. No focal renal lesions. No echogenic renal stones. No
urinary obstruction.

Bladder: Appears normal given degree distention. Bilateral ureteral
jets are identified.

_________________________________________________________

RENAL DUPLEX ULTRASOUND

Right Renal Artery Velocities:

Origin:  Not visualize cm/sec

Mid:  Not visualized cm/sec

Hilum:  80 cm/sec

Interlobar:  55 cm/sec

Arcuate:  30 cm/sec

Left Renal Artery Velocities:

Origin:  Not visualized cm/sec

Mid:  61 cm/sec

Hilum:  66 cm/sec

Interlobar:  55 cm/sec

Arcuate:  27 cm/sec

Aortic Velocity:  75 cm/sec

Right Renal-Aortic Ratios:

Origin: N/A

Mid:  N/A

Hilum:

Interlobar:

Arcuate:

Left Renal-Aortic Ratios:

Origin: N/A

Mid:

Hilum:

Interlobar:

Arcuate:

Normal velocities and low resistance waveforms are demonstrated
throughout the interrogated portions of the bilateral renal arteries
and renal parenchyma however note, the origin of the bilateral and
mid aspect of the right renal artery were not visualized due to
overlying bowel gas.

The bilateral renal veins appear patent.

No evidence of abdominal aortic aneurysm.

There is diffuse increased echogenicity of the hepatic parenchyma
suggestive of hepatic steatosis. Additionally, there is an
approximately 4.0 x 3.5 x 3.4 cm hypoechoic mass within the
subcapsular aspect of the incidentally imaged right lobe of the
liver (images 11 through 15).
IMPRESSION: 1. No evidence of renal artery stenosis on this body habitus
degraded examination with nonvisualization of the origin of the
bilateral renal arteries as well as the mid aspect of the right
renal artery. If clinical concern persists, further evaluation with
CTA could be performed as clinically indicated.
2. Findings suggestive of hepatic steatosis was incidentally noted
approximately 4.0 cm hypoechoic mass within the subcapsular aspect
of the right lobe of the liver. While this mass could represent an
area of focal fatty sparing is incompletely characterized on present
examination. Comparison with prior outside examinations (if
available), is advised. Otherwise, further evaluation with abdominal
MRI could be performed as indicated.

These results will be called to the ordering clinician or
representative by the Radiologist Assistant, and communication
documented in the PACS or [REDACTED].

## 2021-11-24 ENCOUNTER — Encounter: Payer: Self-pay | Admitting: Internal Medicine

## 2022-12-05 ENCOUNTER — Ambulatory Visit
Admission: EM | Admit: 2022-12-05 | Discharge: 2022-12-05 | Disposition: A | Discharge status: Home / Self Care | Payer: BC Managed Care – PPO

## 2022-12-05 DIAGNOSIS — J00 Acute nasopharyngitis [common cold]: Secondary | ICD-10-CM | POA: Diagnosis not present

## 2022-12-05 DIAGNOSIS — H109 Unspecified conjunctivitis: Secondary | ICD-10-CM | POA: Diagnosis not present

## 2022-12-05 DIAGNOSIS — J208 Acute bronchitis due to other specified organisms: Secondary | ICD-10-CM | POA: Diagnosis not present

## 2022-12-05 LAB — POC COVID19/FLU A&B COMBO
Covid Antigen, POC: NEGATIVE
Influenza A Antigen, POC: NEGATIVE
Influenza B Antigen, POC: NEGATIVE

## 2022-12-05 MED ORDER — PREDNISONE 20 MG PO TABS: 20.0000 mg | ORAL_TABLET | Freq: Every day | ORAL | 0 refills | Status: AC

## 2022-12-05 MED ORDER — PROMETHAZINE-DM 6.25-15 MG/5ML PO SYRP: 5.0000 mL | ORAL_SOLUTION | Freq: Three times a day (TID) | ORAL | 0 refills | Status: DC | PRN

## 2022-12-05 MED ORDER — POLYMYXIN B-TRIMETHOPRIM 10000-0.1 UNIT/ML-% OP SOLN: 1.0000 [drp] | Freq: Three times a day (TID) | OPHTHALMIC | 0 refills | Status: AC

## 2022-12-05 MED ORDER — POLYMYXIN B-TRIMETHOPRIM 10000-0.1 UNIT/ML-% OP SOLN: 1.0000 [drp] | Freq: Three times a day (TID) | OPHTHALMIC | 0 refills | Status: DC

## 2022-12-05 NOTE — Discharge Instructions (Addendum)
Treating you for viral bronchitis start prednisone 20 mg daily for the next 5 days to help with the congestion and inflammation in chest.  Prednisone will also help with nasal symptoms.  Promethazine DM for cough and congestion.  Polytrim 3 times daily 1 drop to right eye for the next days if he develop any symptoms in the left eye as prescribed.  If symptoms not improving within the next 5 to 7 days return for evaluation.

## 2022-12-05 NOTE — ED Provider Notes (Signed)
[name]    CSN: [id number] Arrival date & time: 12/05/22  0827      History   Chief Complaint Chief Complaint  Patient presents with   URI    HPI Don Ortega is a 36 y.o. male.  Presents today with right drainage and crusting, cough, runny nose and sinus pressure with ear fullness x 5 days.  Patient recently traveled out of state and symptoms have progressively worsened over the weekend.  Denies any known exposures to any acute viral illnesses.  Patient has a low-grade fever on arrival.  Patient has attempted relief with Nyquil and Dayquil with minimal improvement of  symptoms. Patient endorses that both his  children have been sick with URI symptoms and pink eye. Denies history of asthma or bronchitis.  Past Medical History:  Diagnosis Date   Hypercalcemia 05/24/2019   Hypertension    Obesity    Vitamin D deficiency 05/24/2019    Patient Active Problem List   Diagnosis Date Noted   Vitamin D deficiency 05/24/2019   Hypercalcemia 05/24/2019   Fatty liver 05/23/2019   Liver mass, right lobe 05/23/2019   Screening for lipid disorders 05/23/2019   Hypertension     History reviewed. No pertinent surgical history.     Home Medications    Prior to Admission medications   Medication Sig Start Date End Date Taking? Authorizing Provider  ketoconazole (NIZORAL) 2 % shampoo SMARTSIG:Topical 2-3 Times Weekly 11/26/22  Yes [provider]  predniSONE (DELTASONE) 20 MG tablet Take 1 tablet (20 mg total) by mouth daily with breakfast for 5 days. 12/05/22 12/10/22 Yes Bing Neighbors, NP  promethazine-dextromethorphan (PROMETHAZINE-DM) 6.25-15 MG/5ML syrup Take 5 mLs by mouth 3 (three) times daily as needed for cough. 12/05/22  Yes Bing Neighbors, NP  amLODipine (NORVASC) 10 MG tablet Take 1 tablet (10 mg total) by mouth daily. Patient not taking: Reported on 12/05/2022 01/17/20   Hetty Blend L, NP-C  cyclobenzaprine (FLEXERIL) 10 MG tablet Take 1  tablet (10 mg total) by mouth 2 (two) times daily as needed for muscle spasms. Patient not taking: Reported on 12/05/2022 04/06/20   Merrilee Jansky, MD  hydrochlorothiazide (MICROZIDE) 12.5 MG capsule Take 1 capsule (12.5 mg total) by mouth daily. 05/25/20   Henson, Vickie L, NP-C  ibuprofen (ADVIL) 600 MG tablet Take 1 tablet (600 mg total) by mouth every 6 (six) hours as needed. Patient not taking: Reported on 12/05/2022 04/06/20   Merrilee Jansky, MD  trimethoprim-polymyxin b (POLYTRIM) ophthalmic solution Place 1 drop into the right eye in the morning, at noon, and at bedtime for 7 days. 12/05/22 12/12/22  Bing Neighbors, NP    Family History Family History  Problem Relation Age of Onset   Breast cancer Mother    Colon polyps Mother    Diabetes Maternal Great-grandmother    Colon cancer Neg Hx    Esophageal cancer Neg Hx     Social History Social History   Tobacco Use   Smoking status: Never   Smokeless tobacco: Never  Vaping Use   Vaping status: Never Used  Substance Use Topics   Alcohol use: Never   Drug use: Never     Allergies   Patient has no known allergies.   Review of Systems Review of Systems Pertinent negatives listed in HPI   Physical Exam Triage Vital Signs ED Triage Vitals  Encounter Vitals Group     BP 12/05/22 0853 (!) 151/105     Systolic  BP Percentile --      Diastolic BP Percentile --      Pulse Rate 12/05/22 0853 96     Resp 12/05/22 0853 18     Temp 12/05/22 0853 99.1 F (37.3 C)     Temp src --      SpO2 12/05/22 0853 95 %     Weight --      Height --      Head Circumference --      Peak Flow --      Pain Score 12/05/22 0857 5     Pain Loc --      Pain Education --      Exclude from Growth Chart --    No data found.  Updated Vital Signs BP (!) 151/105   Pulse 96   Temp 99.1 F (37.3 C)   Resp 18   SpO2 95%   Visual Acuity Right Eye Distance:   Left Eye Distance:   Bilateral Distance:    Right Eye Near:   Left  Eye Near:    Bilateral Near:     Physical Exam Vitals reviewed.  Constitutional:      Appearance: Normal appearance.  HENT:     Head: Normocephalic and atraumatic.     Right Ear: Tympanic membrane, ear canal and external ear normal.     Left Ear: Tympanic membrane, ear canal and external ear normal.     Nose: Congestion and rhinorrhea present.     Mouth/Throat:     Mouth: Mucous membranes are dry.  Eyes:     General:        Right eye: Discharge present.     Extraocular Movements: Extraocular movements intact.  Cardiovascular:     Rate and Rhythm: Normal rate and regular rhythm.  Pulmonary:     Breath sounds: Rales present.  Musculoskeletal:        General: Normal range of motion.  Lymphadenopathy:     Cervical: Cervical adenopathy present.  Skin:    General: Skin is warm and dry.  Neurological:     General: No focal deficit present.     Mental Status: He is alert.    UC Treatments / Results  Labs (all labs ordered are listed, but only abnormal results are displayed) Labs Reviewed  POC COVID19/FLU A&B COMBO    EKG   Radiology No results found.  Procedures Procedures (including critical care time)  Medications Ordered in UC Medications - No data to display  Initial Impression / Assessment and Plan / UC Course  I have reviewed the triage vital signs and the nursing notes.  Pertinent labs & imaging results that were available during my care of the patient were reviewed by me and considered in my medical decision making (see chart for details).    Rapid COVID flu testing negative.  Symptoms and exam findings consistent with acute bronchitis and acute rhinitis, with prednisone 20 mg once daily for 5 days and Promethazine DM for cough and nasal symptoms. Exam findings significant for infective conjunctivitis involving the right eye , tx with Polytrim TID x 7 days.  Return precautions given if symptoms worsen or do not improve. Final Clinical Impressions(s) / UC  Diagnoses   Final diagnoses:  Infection of conjunctiva of right eye  Acute viral bronchitis  Acute rhinitis     Discharge Instructions      Treating you for viral bronchitis start prednisone 20 mg daily for the next 5 days to help with the  congestion and inflammation in chest.  Prednisone will also help with nasal symptoms.  Promethazine DM for cough and congestion.  Polytrim 3 times daily 1 drop to right eye for the next days if he develop any symptoms in the left eye as prescribed.  If symptoms not improving within the next 5 to 7 days return for evaluation.     ED Prescriptions     Medication Sig Dispense Auth. Provider   trimethoprim-polymyxin b (POLYTRIM) ophthalmic solution  (Status: Discontinued) Place 1 drop into the right eye in the morning, at noon, and at bedtime. 10 mL Bing Neighbors, NP   predniSONE (DELTASONE) 20 MG tablet Take 1 tablet (20 mg total) by mouth daily with breakfast for 5 days. 5 tablet Bing Neighbors, NP   promethazine-dextromethorphan (PROMETHAZINE-DM) 6.25-15 MG/5ML syrup Take 5 mLs by mouth 3 (three) times daily as needed for cough. 180 mL Bing Neighbors, NP   trimethoprim-polymyxin b (POLYTRIM) ophthalmic solution Place 1 drop into the right eye in the morning, at noon, and at bedtime for 7 days. 10 mL Bing Neighbors, NP      PDMP not reviewed this encounter.   Bing Neighbors, NP 12/05/22 [phone]

## 2022-12-05 NOTE — ED Triage Notes (Signed)
Patient to Urgent Care with complaints of right sided eye drainage/ crusting/ cough/ runny nose/ sinus pain and nasal congestion/ bilateral ear fullness. Muffled hearing in right ear.  Symptoms started Thursday. Reports plane travel to and from [place] over the weekend.

## 2023-08-24 ENCOUNTER — Ambulatory Visit
Admission: EM | Admit: 2023-08-24 | Discharge: 2023-08-24 | Disposition: A | Discharge status: Home / Self Care | Attending: Emergency Medicine | Admitting: Emergency Medicine

## 2023-08-24 DIAGNOSIS — R369 Urethral discharge, unspecified: Secondary | ICD-10-CM | POA: Insufficient documentation

## 2023-08-24 DIAGNOSIS — R300 Dysuria: Secondary | ICD-10-CM | POA: Insufficient documentation

## 2023-08-24 LAB — POCT URINE DIPSTICK
Bilirubin, UA: NEGATIVE
Glucose, UA: NEGATIVE mg/dL
Nitrite, UA: NEGATIVE
POC PROTEIN,UA: 30 — AB
Spec Grav, UA: 1.025 (ref 1.010–1.025)
Urobilinogen, UA: 0.2 U/dL
pH, UA: 5.5 (ref 5.0–8.0)

## 2023-08-24 MED ORDER — DOXYCYCLINE HYCLATE 100 MG PO CAPS: 100.0000 mg | ORAL_CAPSULE | Freq: Two times a day (BID) | ORAL | 0 refills | Status: AC

## 2023-08-24 NOTE — ED Triage Notes (Signed)
 Patient to Urgent Care with complaints of foul smelling urine/ dysuria/ penile discharge/ left sided lower back pain. Denies any fevers.   Symptoms x7 days.   Denies any STD concerns.

## 2023-08-24 NOTE — Discharge Instructions (Addendum)
 Take the antibiotic as directed.  The urine culture is pending.  We will call you if it shows the need to change or discontinue your antibiotic.    Your other tests are pending.  If your test results are positive, we will call you.  You and your sexual partner(s) may require treatment at that time.  Do not have sexual activity for at least 7 days.    Follow-up with your primary care provider if your symptoms are not improving.

## 2023-08-24 NOTE — ED Provider Notes (Signed)
 Don Ortega    CSN: [id number] Arrival date & time: 08/24/23  1155      History   Chief Complaint Chief Complaint  Patient presents with   Urinary Frequency    HPI Don Ortega is a 38 y.o. male.  Patient presents with 1 week history of dysuria, malodorous urine, small amount of milky white penile discharge, left lower back pain.  No fever, rash, lesions, testicular pain, abdominal pain, hematuria, flank pain.  No OTC medications taken today.  Patient states he is in a monogamous relationship and denies concern for STDs.  The history is provided by the patient and medical records.    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 No pertinent surgical history.     Home Medications    Prior to Admission medications   Medication Sig Start Date End Date Taking? Authorizing Provider  doxycycline  (VIBRAMYCIN ) 100 MG capsule Take 1 capsule (100 mg total) by mouth 2 (two) times daily for 7 days. 08/24/23 08/31/23 Yes Corlis Burnard DEL, NP  amLODipine  (NORVASC ) 10 MG tablet Take 1 tablet (10 mg total) by mouth daily. Patient not taking: Reported on 12/05/2022 01/17/20   Lendia Boby CROME, NP-C  cyclobenzaprine  (FLEXERIL ) 10 MG tablet Take 1 tablet (10 mg total) by mouth 2 (two) times daily as needed for muscle spasms. Patient not taking: Reported on 12/05/2022 04/06/20   Blaise Aleene KIDD, MD  hydrochlorothiazide  (MICROZIDE ) 12.5 MG capsule Take 1 capsule (12.5 mg total) by mouth daily. 05/25/20   Henson, Vickie L, NP-C  ibuprofen  (ADVIL ) 600 MG tablet Take 1 tablet (600 mg total) by mouth every 6 (six) hours as needed. Patient not taking:  Reported on 12/05/2022 04/06/20   Blaise Aleene KIDD, MD  ketoconazole (NIZORAL) 2 % shampoo SMARTSIG:Topical 2-3 Times Weekly 11/26/22   [provider]  promethazine -dextromethorphan (PROMETHAZINE -DM) 6.25-15 MG/5ML syrup Take 5 mLs by mouth 3 (three) times daily as needed for cough. Patient not taking: Reported on 08/24/2023 12/05/22   Arloa Suzen RAMAN, NP    Family History Family History  Problem Relation Age of Onset   Breast cancer Mother    Colon polyps Mother    Diabetes Maternal Great-grandmother    Colon cancer Neg Hx    Esophageal cancer Neg Hx     Social History Social History   Tobacco Use   Smoking status: Never   Smokeless tobacco: Never  Vaping Use   Vaping status: Never Used  Substance Use Topics   Alcohol use: Never   Drug use: Never     Allergies   Patient has no known allergies.   Review of Systems Review of Systems  Constitutional:  Negative for chills and fever.  Gastrointestinal:  Negative for abdominal pain.  Genitourinary:  Positive for dysuria and penile discharge. Negative for flank pain, hematuria and testicular pain.  Musculoskeletal:  Positive for back pain. Negative for gait problem.  Skin:  Negative for color change and rash.     Physical Exam Triage Vital Signs ED Triage Vitals  Encounter Vitals Group     BP 08/24/23 1254 (!) 142/89  Girls Systolic BP Percentile --      Girls Diastolic BP Percentile --      Boys Systolic BP Percentile --      Boys Diastolic BP Percentile --      Pulse Rate 08/24/23 1254 96     Resp 08/24/23 1254 18     Temp 08/24/23 1254 97.8 F (36.6 C)     Temp src --      SpO2 08/24/23 1254 96 %     Weight --      Height --      Head Circumference --      Peak Flow --      Pain Score 08/24/23 1259 4     Pain Loc --      Pain Education --      Exclude from Growth Chart --    No data found.  Updated Vital Signs BP (!) 142/89   Pulse 96   Temp 97.8 F (36.6 C)   Resp 18   SpO2 96%    Visual Acuity Right Eye Distance:   Left Eye Distance:   Bilateral Distance:    Right Eye Near:   Left Eye Near:    Bilateral Near:     Physical Exam Constitutional:      General: He is not in acute distress. HENT:     Mouth/Throat:     Mouth: Mucous membranes are moist.  Cardiovascular:     Rate and Rhythm: Normal rate and regular rhythm.  Pulmonary:     Effort: Pulmonary effort is normal. No respiratory distress.  Abdominal:     General: Bowel sounds are normal.     Palpations: Abdomen is soft.     Tenderness: There is no abdominal tenderness. There is no right CVA tenderness, left CVA tenderness, guarding or rebound.  Genitourinary:    Comments: Patient declines GU exam. Neurological:     Mental Status: He is alert.      UC Treatments / Results  Labs (all labs ordered are listed, but only abnormal results are displayed) Labs Reviewed  POCT URINE DIPSTICK - Abnormal; Notable for the following components:      Result Value   Clarity, UA cloudy (*)    Ketones, POC UA trace (5) (*)    Blood, UA trace-lysed (*)    POC PROTEIN,UA =30 (*)    Leukocytes, UA Moderate (2+) (*)    All other components within normal limits  URINE CULTURE  CYTOLOGY, (ORAL, ANAL, URETHRAL) ANCILLARY ONLY    EKG   Radiology No results found.  Procedures Procedures (including critical care time)  Medications Ordered in UC Medications - No data to display  Initial Impression / Assessment and Plan / UC Course  I have reviewed the triage vital signs and the nursing notes.  Pertinent labs & imaging results that were available during my care of the patient were reviewed by me and considered in my medical decision making (see chart for details).    Dysuria, penile discharge.  Urine culture pending.  Patient denies concern for STDs and is in a monogamous relationship.  However, he is willing to be tested today and obtained a urethral self swab for testing.  Treating today with 7-day  course of doxycycline .  Instructed him to abstain from all sexual activity until all test results are back and treatment is completed if needed.  Discussed that his test results will be available on his MyChart account and that we will call him if  additional treatment is needed.  Instructed him to follow-up with his PCP if he is not improving.  He agrees to plan of care.  Final Clinical Impressions(s) / UC Diagnoses   Final diagnoses:  Dysuria  Penile discharge     Discharge Instructions      Take the antibiotic as directed.  The urine culture is pending.  We will call you if it shows the need to change or discontinue your antibiotic.    Your other tests are pending.  If your test results are positive, we will call you.  You and your sexual partner(s) may require treatment at that time.  Do not have sexual activity for at least 7 days.    Follow-up with your primary care provider if your symptoms are not improving.        ED Prescriptions     Medication Sig Dispense Auth. Provider   doxycycline  (VIBRAMYCIN ) 100 MG capsule Take 1 capsule (100 mg total) by mouth 2 (two) times daily for 7 days. 14 capsule Corlis Burnard DEL, NP      PDMP not reviewed this encounter.   Corlis Burnard DEL, NP 08/24/23 1328

## 2023-08-25 LAB — CYTOLOGY, (ORAL, ANAL, URETHRAL) ANCILLARY ONLY
Chlamydia: NEGATIVE
Comment: NEGATIVE
Comment: NEGATIVE
Comment: NORMAL
Neisseria Gonorrhea: NEGATIVE
Trichomonas: NEGATIVE

## 2023-08-28 ENCOUNTER — Ambulatory Visit (HOSPITAL_COMMUNITY): Payer: Self-pay

## 2023-08-28 LAB — URINE CULTURE: Culture: >=100000 — AB

## 2023-08-28 MED ORDER — CEPHALEXIN 500 MG PO CAPS: 500.0000 mg | ORAL_CAPSULE | Freq: Three times a day (TID) | ORAL | 0 refills | Status: AC

## 2023-11-08 ENCOUNTER — Encounter: Payer: Self-pay | Admitting: Family

## 2023-11-08 ENCOUNTER — Ambulatory Visit: Admitting: Family

## 2023-11-08 VITALS — BP 150/88 | HR 73 | Ht 74.0 in | Wt 295.4 lb

## 2023-11-08 DIAGNOSIS — K760 Fatty (change of) liver, not elsewhere classified: Secondary | ICD-10-CM

## 2023-11-08 DIAGNOSIS — Z13220 Encounter for screening for lipoid disorders: Secondary | ICD-10-CM

## 2023-11-08 DIAGNOSIS — R7303 Prediabetes: Secondary | ICD-10-CM | POA: Insufficient documentation

## 2023-11-08 DIAGNOSIS — E559 Vitamin D deficiency, unspecified: Secondary | ICD-10-CM | POA: Diagnosis not present

## 2023-11-08 DIAGNOSIS — I10 Essential (primary) hypertension: Secondary | ICD-10-CM

## 2023-11-08 DIAGNOSIS — E538 Deficiency of other specified B group vitamins: Secondary | ICD-10-CM | POA: Insufficient documentation

## 2023-11-08 MED ORDER — HYDROCHLOROTHIAZIDE 12.5 MG PO CAPS: 12.5000 mg | ORAL_CAPSULE | Freq: Every day | ORAL | 0 refills | Status: AC

## 2023-11-08 MED ORDER — AMLODIPINE BESYLATE 10 MG PO TABS: 10.0000 mg | ORAL_TABLET | Freq: Every day | ORAL | 0 refills | Status: AC

## 2023-11-08 NOTE — Assessment & Plan Note (Signed)
-   Check labs today - Supplementation recommended based off lab results and will notify patient at that time

## 2023-11-08 NOTE — Progress Notes (Unsigned)
 Established Patient Office Visit  Subjective:  Patient ID: Don Ortega, male    DOB: 11-04-1985  Age: 38 y.o. MRN: [id number]  Chief Complaint  Patient presents with   Follow-up    Medication refills    Patient is here today for his follow up. It has been some time since he was here last. He has been feeling fairly well since last appointment.   He does not have additional concerns to discuss today. Labs are due today.  He needs refills as he is out of his blood pressure medications. BP was previously well controlled on his current medications.  I have reviewed his active problem list, medication list, allergies, family history, social history, health maintenance, notes from last encounter, lab results for his appointment today.    Patient declines wanting flu shot at this time.    No other concerns at this time.   Past Medical History:  Diagnosis Date   Hypercalcemia 05/24/2019   Hypertension    Obesity    Vitamin D  deficiency 05/24/2019    History reviewed. No pertinent surgical history.  Social History   Socioeconomic History   Marital status: Significant Other    Spouse name: Not on file   Number of children: Not on file   Years of education: Not on file   Highest education level: Not on file  Occupational History   Not on file  Tobacco Use   Smoking status: Never   Smokeless tobacco: Never  Vaping Use   Vaping status: Never Used  Substance and Sexual Activity   Alcohol use: Never   Drug use: Never   Sexual activity: Yes  Other Topics Concern   Not on file  Social History Narrative   Not on file   Social Drivers of Health   Financial Resource Strain: Not on file  Food Insecurity: Not on file  Transportation Needs: Not on file  Physical Activity: Not on file  Stress: Not on file  Social Connections: Unknown (05/24/2021)   Received from [facility]   Social Network    Social Network: Not on file  Intimate Partner Violence: Unknown  (04/15/2021)   Received from [facility]   HITS    Physically Hurt: Not on file    Insult or Talk Down To: Not on file    Threaten Physical Harm: Not on file    Scream or Curse: Not on file    Family History  Problem Relation Age of Onset   Breast cancer Mother    Colon polyps Mother    Diabetes Maternal Great-grandmother    Colon cancer Neg Hx    Esophageal cancer Neg Hx     No Known Allergies  Review of Systems  Constitutional:  Negative for malaise/fatigue.  HENT: Negative.    Eyes:  Negative for blurred vision and pain.  Respiratory:  Negative for cough and shortness of breath.   Cardiovascular:  Negative for chest pain, palpitations, claudication and leg swelling.  Gastrointestinal:  Negative for abdominal pain, blood in stool, constipation, diarrhea, nausea and vomiting.  Genitourinary:  Negative for dysuria, frequency and urgency.  Musculoskeletal: Negative.   Skin: Negative.   Neurological:  Negative for dizziness, tingling, sensory change and headaches.  Endo/Heme/Allergies: Negative.   Psychiatric/Behavioral: Negative.         Objective:   BP (!) 150/88   Pulse 73   Ht 6' 2 (1.88 m)   Wt 295 lb 6.4 oz (134 kg)   SpO2 98%  BMI 37.93 kg/m   Vitals:   11/08/23 1126  BP: (!) 150/88  Pulse: 73  Height: 6' 2 (1.88 m)  Weight: 295 lb 6.4 oz (134 kg)  SpO2: 98%  BMI (Calculated): 37.91    Physical Exam Vitals and nursing note reviewed.  Constitutional:      Appearance: Normal appearance.  HENT:     Head: Normocephalic.  Eyes:     Extraocular Movements: Extraocular movements intact.     Pupils: Pupils are equal, round, and reactive to light.  Cardiovascular:     Rate and Rhythm: Normal rate and regular rhythm.     Pulses: Normal pulses.     Heart sounds: Normal heart sounds. No murmur heard. Pulmonary:     Effort: Pulmonary effort is normal. No respiratory distress.     Breath sounds: Normal breath sounds.  Abdominal:     General: There is  no distension.     Tenderness: There is no abdominal tenderness.  Musculoskeletal:        General: No tenderness. Normal range of motion.     Cervical back: Normal range of motion and neck supple.     Right lower leg: No edema.     Left lower leg: No edema.  Skin:    General: Skin is warm and dry.     Coloration: Skin is not jaundiced.     Findings: No erythema.  Neurological:     General: No focal deficit present.     Mental Status: He is alert and oriented to person, place, and time.  Psychiatric:        Mood and Affect: Mood normal.        Speech: Speech normal.        Behavior: Behavior is cooperative.        Cognition and Memory: Memory is not impaired.      No results found for any visits on 11/08/23.  Recent Results (from the past 2160 hours)  POCT URINE DIPSTICK     Status: Abnormal   Collection Time: 08/24/23  1:06 PM  Result Value Ref Range   Color, UA yellow yellow   Clarity, UA cloudy (A) clear   Glucose, UA negative negative mg/dL   Bilirubin, UA negative negative   Ketones, POC UA trace (5) (A) negative mg/dL   Spec Grav, UA 8.974 8.989 - 1.025   Blood, UA trace-lysed (A) negative   pH, UA 5.5 5.0 - 8.0   POC PROTEIN,UA =30 (A) negative, trace   Urobilinogen, UA 0.2 0.2 or 1.0 E.U./dL   Nitrite, UA Negative Negative   Leukocytes, UA Moderate (2+) (A) Negative  Urine Culture     Status: Abnormal   Collection Time: 08/24/23  1:09 PM   Specimen: Urine, Clean Catch  Result Value Ref Range   Specimen Description URINE, CLEAN CATCH    Special Requests      NONE Performed at [facility], [address], [place], [place] [address]    Culture >=100,000 COLONIES/mL ESCHERICHIA COLI (A)    Report Status 08/28/2023 FINAL    Organism ID, Bacteria ESCHERICHIA COLI (A)       Susceptibility   Escherichia coli - MIC*    AMPICILLIN >=32 RESISTANT Resistant     CEFEPIME <=0.12 SENSITIVE Sensitive     ERTAPENEM <=0.12 SENSITIVE Sensitive     CEFTRIAXONE  <=0.25 SENSITIVE Sensitive     CIPROFLOXACIN <=0.06 SENSITIVE Sensitive     GENTAMICIN <=1 SENSITIVE Sensitive  NITROFURANTOIN <=16 SENSITIVE Sensitive     TRIMETH/SULFA <=20 SENSITIVE Sensitive     AMPICILLIN/SULBACTAM 16 INTERMEDIATE Intermediate     PIP/TAZO Value in next row Sensitive ug/mL     <=4 SENSITIVEThis is a modified FDA-approved test that has been validated and its performance characteristics determined by the reporting laboratory.  This laboratory is certified under the Clinical Laboratory Improvement Amendments CLIA as qualified to perform high complexity clinical laboratory testing.    MEROPENEM Value in next row Sensitive      <=4 SENSITIVEThis is a modified FDA-approved test that has been validated and its performance characteristics determined by the reporting laboratory.  This laboratory is certified under the Clinical Laboratory Improvement Amendments CLIA as qualified to perform high complexity clinical laboratory testing.    CEFAZOLIN (URINE) Value in next row Sensitive      4 SENSITIVEThis is a modified FDA-approved test that has been validated and its performance characteristics determined by the reporting laboratory.  This laboratory is certified under the Clinical Laboratory Improvement Amendments CLIA as qualified to perform high complexity clinical laboratory testing.    * >=100,000 COLONIES/mL ESCHERICHIA COLI  Cytology Ancillary Only -Urethral; GC / Chlamydia, Trichomonas     Status: None   Collection Time: 08/24/23  1:20 PM  Result Value Ref Range   Neisseria Gonorrhea Negative    Chlamydia Negative    Trichomonas Negative    Comment Normal Reference Ranger Chlamydia - Negative    Comment      Normal Reference Range Neisseria Gonorrhea - Negative   Comment Normal Reference Range Trichomonas - Negative        Assessment & Plan:   Assessment & Plan Primary hypertension Fatty liver Screening for lipid disorders Prediabetes - Continue healthy diet and  exercise as tolerated. - Continue medications as prescribed. Refills sent. - Check labs today  - BP goal <130/80 Vitamin D  deficiency B12 deficiency - Check labs today - Supplementation recommended based off lab results and will notify patient at that time     Return in about 4 months (around 03/09/2024).   Total time spent: 25 minutes  Oddis DELENA Cain, FNP  11/08/2023   This document may have been prepared by [facility] Voice Recognition software and as such may include unintentional dictation errors.

## 2023-11-08 NOTE — Assessment & Plan Note (Signed)
-   Continue healthy diet and exercise as tolerated. - Continue medications as prescribed. Refills sent. - Check labs today  - BP goal <130/80

## 2023-11-09 LAB — CBC WITH DIFFERENTIAL/PLATELET
Basophils Absolute: 0 x10E3/uL (ref 0.0–0.2)
Basos: 0 %
EOS (ABSOLUTE): 0.1 x10E3/uL (ref 0.0–0.4)
Eos: 1 %
Hematocrit: 51.1 % — ABNORMAL HIGH (ref 37.5–51.0)
Hemoglobin: 16.8 g/dL (ref 13.0–17.7)
Immature Grans (Abs): 0 x10E3/uL (ref 0.0–0.1)
Immature Granulocytes: 0 %
Lymphocytes Absolute: 3.3 x10E3/uL — ABNORMAL HIGH (ref 0.7–3.1)
Lymphs: 36 %
MCH: 29.9 pg (ref 26.6–33.0)
MCHC: 32.9 g/dL (ref 31.5–35.7)
MCV: 91 fL (ref 79–97)
Monocytes Absolute: 0.7 x10E3/uL (ref 0.1–0.9)
Monocytes: 8 %
Neutrophils Absolute: 4.9 x10E3/uL (ref 1.4–7.0)
Neutrophils: 55 %
Platelets: 331 x10E3/uL (ref 150–450)
RBC: 5.61 x10E6/uL (ref 4.14–5.80)
RDW: 13.7 % (ref 11.6–15.4)
WBC: 9 x10E3/uL (ref 3.4–10.8)

## 2023-11-09 LAB — CMP14+EGFR
ALT: 48 IU/L — ABNORMAL HIGH (ref 0–44)
AST: 29 IU/L (ref 0–40)
Albumin: 4.6 g/dL (ref 4.1–5.1)
Alkaline Phosphatase: 78 IU/L (ref 47–123)
BUN/Creatinine Ratio: 5 — ABNORMAL LOW (ref 9–20)
BUN: 6 mg/dL (ref 6–20)
Bilirubin Total: 0.4 mg/dL (ref 0.0–1.2)
CO2: 22 mmol/L (ref 20–29)
Calcium: 10.3 mg/dL — ABNORMAL HIGH (ref 8.7–10.2)
Chloride: 105 mmol/L (ref 96–106)
Creatinine, Ser: 1.25 mg/dL (ref 0.76–1.27)
Globulin, Total: 2.5 g/dL (ref 1.5–4.5)
Glucose: 89 mg/dL (ref 70–99)
Potassium: 4.2 mmol/L (ref 3.5–5.2)
Sodium: 144 mmol/L (ref 134–144)
Total Protein: 7.1 g/dL (ref 6.0–8.5)
eGFR: 77 mL/min/1.73 (ref >59)

## 2023-11-09 LAB — LIPID PANEL
Chol/HDL Ratio: 7.7 ratio — ABNORMAL HIGH (ref 0.0–5.0)
Cholesterol, Total: 199 mg/dL (ref 100–199)
HDL: 26 mg/dL — ABNORMAL LOW (ref >39)
LDL Chol Calc (NIH): 99 mg/dL (ref 0–99)
Triglycerides: 436 mg/dL — ABNORMAL HIGH (ref 0–149)
VLDL Cholesterol Cal: 74 mg/dL — ABNORMAL HIGH (ref 5–40)

## 2023-11-09 LAB — HEMOGLOBIN A1C
Est. average glucose Bld gHb Est-mCnc: 114 mg/dL
Hgb A1c MFr Bld: 5.6 % (ref 4.8–5.6)

## 2023-11-09 LAB — VITAMIN B12: Vitamin B-12: 278 pg/mL (ref 232–1245)

## 2023-11-09 LAB — VITAMIN D 25 HYDROXY (VIT D DEFICIENCY, FRACTURES): Vit D, 25-Hydroxy: 11.4 ng/mL — ABNORMAL LOW (ref 30.0–100.0)

## 2024-02-15 ENCOUNTER — Other Ambulatory Visit: Payer: Self-pay

## 2024-02-15 ENCOUNTER — Ambulatory Visit: Payer: Self-pay

## 2024-02-15 MED ORDER — VITAMIN D (ERGOCALCIFEROL) 1.25 MG (50000 UNIT) PO CAPS: 50000.0000 [IU] | ORAL_CAPSULE | ORAL | 3 refills | Status: AC

## 2024-03-07 ENCOUNTER — Ambulatory Visit: Admitting: Family

## 2024-03-08 ENCOUNTER — Ambulatory Visit: Admitting: Family
